# Patient Record
Sex: Male | Born: 1950 | Race: White | Hispanic: No | Marital: Married | Smoking: Current every day smoker
Health system: Southern US, Community
[De-identification: ages and names within clinical notes are randomized; demographics above are authoritative.]

## PROBLEM LIST (undated history)

## (undated) DIAGNOSIS — F329 Major depressive disorder, single episode, unspecified: Secondary | ICD-10-CM

## (undated) DIAGNOSIS — S8290XB Unspecified fracture of unspecified lower leg, initial encounter for open fracture type I or II: Secondary | ICD-10-CM

## (undated) DIAGNOSIS — J45909 Unspecified asthma, uncomplicated: Secondary | ICD-10-CM

## (undated) DIAGNOSIS — H6060 Unspecified chronic otitis externa, unspecified ear: Secondary | ICD-10-CM

## (undated) DIAGNOSIS — M5030 Other cervical disc degeneration, unspecified cervical region: Secondary | ICD-10-CM

## (undated) DIAGNOSIS — S82891A Other fracture of right lower leg, initial encounter for closed fracture: Secondary | ICD-10-CM

## (undated) DIAGNOSIS — F32A Depression, unspecified: Secondary | ICD-10-CM

## (undated) DIAGNOSIS — G4700 Insomnia, unspecified: Secondary | ICD-10-CM

## (undated) DIAGNOSIS — K449 Diaphragmatic hernia without obstruction or gangrene: Secondary | ICD-10-CM

## (undated) DIAGNOSIS — E7800 Pure hypercholesterolemia, unspecified: Secondary | ICD-10-CM

## (undated) DIAGNOSIS — G510 Bell's palsy: Secondary | ICD-10-CM

## (undated) DIAGNOSIS — F419 Anxiety disorder, unspecified: Secondary | ICD-10-CM

## (undated) DIAGNOSIS — S5290XB Unspecified fracture of unspecified forearm, initial encounter for open fracture type I or II: Secondary | ICD-10-CM

## (undated) DIAGNOSIS — I10 Essential (primary) hypertension: Secondary | ICD-10-CM

## (undated) DIAGNOSIS — I82409 Acute embolism and thrombosis of unspecified deep veins of unspecified lower extremity: Secondary | ICD-10-CM

## (undated) HISTORY — DX: Major depressive disorder, single episode, unspecified: F32.9

## (undated) HISTORY — PX: BACK SURGERY: SHX140

## (undated) HISTORY — DX: Unspecified fracture of unspecified lower leg, initial encounter for open fracture type I or II: S82.90XB

## (undated) HISTORY — DX: Unspecified fracture of unspecified forearm, initial encounter for open fracture type I or II: S52.90XB

## (undated) HISTORY — DX: Insomnia, unspecified: G47.00

## (undated) HISTORY — DX: Other cervical disc degeneration, unspecified cervical region: M50.30

## (undated) HISTORY — DX: Diaphragmatic hernia without obstruction or gangrene: K44.9

## (undated) HISTORY — DX: Unspecified chronic otitis externa, unspecified ear: H60.60

## (undated) HISTORY — DX: Other fracture of right lower leg, initial encounter for closed fracture: S82.891A

## (undated) HISTORY — PX: APPENDECTOMY: SHX54

## (undated) HISTORY — DX: Anxiety disorder, unspecified: F41.9

## (undated) HISTORY — DX: Depression, unspecified: F32.A

---

## 2002-07-27 ENCOUNTER — Encounter: Admission: RE | Admit: 2002-07-27 | Discharge: 2002-10-25 | Payer: Self-pay

## 2002-10-11 ENCOUNTER — Encounter: Payer: Self-pay | Admitting: Specialist

## 2002-10-16 ENCOUNTER — Encounter: Payer: Self-pay | Admitting: Specialist

## 2002-10-16 ENCOUNTER — Ambulatory Visit (HOSPITAL_COMMUNITY): Admission: RE | Admit: 2002-10-16 | Discharge: 2002-10-17 | Payer: Self-pay | Admitting: Specialist

## 2002-11-08 ENCOUNTER — Encounter: Admission: RE | Admit: 2002-11-08 | Discharge: 2003-02-06 | Payer: Self-pay

## 2004-10-04 ENCOUNTER — Emergency Department: Payer: Self-pay | Admitting: Unknown Physician Specialty

## 2007-06-25 DIAGNOSIS — H9319 Tinnitus, unspecified ear: Secondary | ICD-10-CM | POA: Insufficient documentation

## 2007-06-25 DIAGNOSIS — I82409 Acute embolism and thrombosis of unspecified deep veins of unspecified lower extremity: Secondary | ICD-10-CM | POA: Insufficient documentation

## 2007-06-25 DIAGNOSIS — M51379 Other intervertebral disc degeneration, lumbosacral region without mention of lumbar back pain or lower extremity pain: Secondary | ICD-10-CM | POA: Insufficient documentation

## 2007-06-25 DIAGNOSIS — G510 Bell's palsy: Secondary | ICD-10-CM | POA: Insufficient documentation

## 2007-06-25 DIAGNOSIS — E669 Obesity, unspecified: Secondary | ICD-10-CM | POA: Insufficient documentation

## 2007-06-25 DIAGNOSIS — I10 Essential (primary) hypertension: Secondary | ICD-10-CM | POA: Insufficient documentation

## 2007-06-25 DIAGNOSIS — M5030 Other cervical disc degeneration, unspecified cervical region: Secondary | ICD-10-CM | POA: Insufficient documentation

## 2007-06-25 DIAGNOSIS — H6690 Otitis media, unspecified, unspecified ear: Secondary | ICD-10-CM | POA: Insufficient documentation

## 2007-06-25 DIAGNOSIS — M5137 Other intervertebral disc degeneration, lumbosacral region: Secondary | ICD-10-CM | POA: Insufficient documentation

## 2007-06-25 DIAGNOSIS — H9190 Unspecified hearing loss, unspecified ear: Secondary | ICD-10-CM | POA: Insufficient documentation

## 2010-03-03 ENCOUNTER — Emergency Department: Payer: Self-pay | Admitting: Unknown Physician Specialty

## 2011-12-06 ENCOUNTER — Emergency Department: Payer: Self-pay | Admitting: Emergency Medicine

## 2011-12-06 LAB — CBC
HCT: 46.4 % (ref 40.0–52.0)
HGB: 15.9 g/dL (ref 13.0–18.0)
MCH: 32.8 pg (ref 26.0–34.0)
WBC: 14.2 10*3/uL — ABNORMAL HIGH (ref 3.8–10.6)

## 2011-12-07 ENCOUNTER — Emergency Department: Payer: Self-pay | Admitting: Unknown Physician Specialty

## 2011-12-07 LAB — COMPREHENSIVE METABOLIC PANEL
Albumin: 3.6 g/dL (ref 3.4–5.0)
Alkaline Phosphatase: 49 U/L — ABNORMAL LOW (ref 50–136)
Anion Gap: 11 (ref 7–16)
BUN: 14 mg/dL (ref 7–18)
Bilirubin,Total: 0.4 mg/dL (ref 0.2–1.0)
Chloride: 102 mmol/L (ref 98–107)
EGFR (African American): >60
EGFR (Non-African Amer.): >60
Glucose: 134 mg/dL — ABNORMAL HIGH (ref 65–99)
Osmolality: 280 (ref 275–301)
Potassium: 3.8 mmol/L (ref 3.5–5.1)
SGOT(AST): 29 U/L (ref 15–37)
SGPT (ALT): 31 U/L
Total Protein: 7.6 g/dL (ref 6.4–8.2)

## 2011-12-07 LAB — TROPONIN I: Troponin-I: <0.02 ng/mL

## 2013-01-25 ENCOUNTER — Emergency Department: Payer: Self-pay | Admitting: Internal Medicine

## 2014-09-14 ENCOUNTER — Other Ambulatory Visit: Payer: Self-pay | Admitting: Family Medicine

## 2014-09-14 DIAGNOSIS — M549 Dorsalgia, unspecified: Principal | ICD-10-CM

## 2014-09-14 DIAGNOSIS — G8929 Other chronic pain: Secondary | ICD-10-CM

## 2014-09-17 ENCOUNTER — Other Ambulatory Visit: Payer: Self-pay | Admitting: Family Medicine

## 2014-09-27 ENCOUNTER — Other Ambulatory Visit: Payer: Self-pay | Admitting: Family Medicine

## 2014-09-27 DIAGNOSIS — Z77018 Contact with and (suspected) exposure to other hazardous metals: Secondary | ICD-10-CM

## 2014-09-27 DIAGNOSIS — M549 Dorsalgia, unspecified: Principal | ICD-10-CM

## 2014-09-27 DIAGNOSIS — G8929 Other chronic pain: Secondary | ICD-10-CM

## 2014-09-27 DIAGNOSIS — M542 Cervicalgia: Secondary | ICD-10-CM

## 2014-10-02 ENCOUNTER — Other Ambulatory Visit: Payer: Self-pay | Admitting: Family Medicine

## 2014-10-15 ENCOUNTER — Ambulatory Visit
Admission: RE | Admit: 2014-10-15 | Discharge: 2014-10-15 | Disposition: A | Discharge status: Home / Self Care | Payer: PRIVATE HEALTH INSURANCE | Source: Ambulatory Visit | Attending: Family Medicine | Admitting: Family Medicine

## 2014-10-15 DIAGNOSIS — M549 Dorsalgia, unspecified: Principal | ICD-10-CM

## 2014-10-15 DIAGNOSIS — G8929 Other chronic pain: Secondary | ICD-10-CM

## 2014-10-15 DIAGNOSIS — Z77018 Contact with and (suspected) exposure to other hazardous metals: Secondary | ICD-10-CM

## 2014-10-15 DIAGNOSIS — M542 Cervicalgia: Secondary | ICD-10-CM

## 2015-01-24 DIAGNOSIS — J449 Chronic obstructive pulmonary disease, unspecified: Secondary | ICD-10-CM | POA: Insufficient documentation

## 2015-05-27 ENCOUNTER — Encounter: Payer: Self-pay | Admitting: Emergency Medicine

## 2015-05-27 ENCOUNTER — Emergency Department
Admission: EM | Admit: 2015-05-27 | Discharge: 2015-05-27 | Disposition: A | Discharge status: Home / Self Care | Payer: Medicare Other | Attending: Emergency Medicine | Admitting: Emergency Medicine

## 2015-05-27 DIAGNOSIS — K029 Dental caries, unspecified: Secondary | ICD-10-CM | POA: Diagnosis not present

## 2015-05-27 DIAGNOSIS — K088 Other specified disorders of teeth and supporting structures: Secondary | ICD-10-CM | POA: Diagnosis present

## 2015-05-27 DIAGNOSIS — Z720 Tobacco use: Secondary | ICD-10-CM | POA: Diagnosis not present

## 2015-05-27 DIAGNOSIS — K047 Periapical abscess without sinus: Secondary | ICD-10-CM | POA: Insufficient documentation

## 2015-05-27 MED ORDER — LIDOCAINE HCL (PF) 1 % IJ SOLN
2.0000 mL | Freq: Once | INTRAMUSCULAR | Status: DC
  Filled 2015-05-27: qty 5

## 2015-05-27 MED ORDER — AMOXICILLIN-POT CLAVULANATE 200-28.5 MG PO CHEW: 1.0000 | CHEWABLE_TABLET | Freq: Two times a day (BID) | ORAL | Status: AC

## 2015-05-27 MED ORDER — TRAMADOL HCL 50 MG PO TABS: 50.0000 mg | ORAL_TABLET | Freq: Four times a day (QID) | ORAL | Status: AC | PRN

## 2015-05-27 MED ORDER — BUPIVACAINE HCL (PF) 0.5 % IJ SOLN
2.0000 mL | Freq: Once | INTRAMUSCULAR | Status: DC
Start: 2015-05-27 — End: 2015-05-27

## 2015-05-27 MED ORDER — LIDOCAINE-EPINEPHRINE 2 %-1:100000 IJ SOLN
INTRAMUSCULAR | Status: AC
  Filled 2015-05-27: qty 1.7

## 2015-05-27 NOTE — ED Notes (Signed)
Reports painful tooth.  No resp distress

## 2015-05-27 NOTE — Discharge Instructions (Signed)

## 2015-05-27 NOTE — ED Provider Notes (Signed)
[facility] Emergency Department Provider Note  ____________________________________________  Time seen: Approximately 10:50 AM  I have reviewed the triage vital signs and the nursing notes.   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  HPI Donald Hester is a 64 y.o. male who presents to the emergency room for a abscessed tooth. He states that he has poor dentition as a whole but does not have finances for [employer]. He states that he is just set up to go to [facility] dental care. He is complaining of an abscessed right canine tooth. Pain, swelling present. No distended complaints of fever, difficulty breathing, difficulty swallowing. Pain is moderate to severe.   History reviewed. No pertinent past medical history.  There are no active problems to display for this patient.   History reviewed. No pertinent past surgical history.  Current Outpatient Rx  Name  Route  Sig  Dispense  Refill  . amoxicillin-clavulanate (AUGMENTIN) 200-28.5 MG per chewable tablet   Oral   Chew 1 tablet by mouth 2 (two) times daily.   14 tablet   0   . traMADol (ULTRAM) 50 MG tablet   Oral   Take 1 tablet (50 mg total) by mouth every 6 (six) hours as needed.   20 tablet   0     Allergies Review of patient's allergies indicates no known allergies.  History reviewed. No pertinent family history.  Social History Social History  Substance Use Topics  . Smoking status: Current Every Day Smoker  . Smokeless tobacco: None  . Alcohol Use: No    Review of Systems Constitutional: No fever/chills Eyes: No visual changes. ENT: No sore throat. Positive right lower canine tooth pain Cardiovascular: Denies chest pain. Respiratory: Denies shortness of breath. Gastrointestinal: No abdominal pain.  No nausea, no vomiting.  No diarrhea.  No constipation. Genitourinary: Negative for dysuria. Musculoskeletal: Negative for back pain. Skin: Negative  for rash. Neurological: Negative for headaches, focal weakness or numbness.  10-point ROS otherwise negative.  ____________________________________________   PHYSICAL EXAM:  VITAL SIGNS: ED Triage Vitals  Enc Vitals Group     BP 05/27/15 1023 128/81 mmHg     Pulse Rate 05/27/15 1023 97     Resp 05/27/15 1023 20     Temp 05/27/15 1023 97.9 F (36.6 C)     Temp Source 05/27/15 1023 Oral     SpO2 05/27/15 1023 97 %     Weight 05/27/15 1023 180 lb (81.647 kg)     Height 05/27/15 1023 5\' 7"  (1.702 m)     Head Cir --      Peak Flow --      Pain Score 05/27/15 1016 8     Pain Loc --      Pain Edu? --      Excl. in [place]? --     Constitutional: Alert and oriented. Well appearing and in no acute distress. Eyes: Conjunctivae are normal. PERRL. EOMI. Head: Atraumatic. Nose: No congestion/rhinnorhea. Mouth/Throat: Mucous membranes are moist.  Oropharynx non-erythematous. Multiple dental caries throughout mouth. No dentition upper. Erythema and edema surrounding right lower canine. Neck: No stridor.   Hematological/Lymphatic/Immunilogical: No cervical lymphadenopathy. Cardiovascular: Normal rate, regular rhythm. Grossly normal heart sounds.  Good peripheral circulation. Respiratory: Normal respiratory effort.  No retractions. Lungs CTAB. Gastrointestinal: Soft and nontender. No distention. No abdominal bruits. No CVA tenderness. Musculoskeletal: No lower extremity tenderness nor edema.  No joint effusions. Neurologic:  Normal speech and language. No gross  focal neurologic deficits are appreciated. No gait instability. Skin:  Skin is warm, dry and intact. No rash noted. Psychiatric: Mood and affect are normal. Speech and behavior are normal.  ____________________________________________   LABS (all labs ordered are listed, but only abnormal results are displayed)  Labs Reviewed - No data to  display ____________________________________________  EKG   ____________________________________________  RADIOLOGY   ____________________________________________   PROCEDURES  Procedure(s) performed: yes, dental block, see procedure note(s).    Critical Care performed: No  ____________________________________________   INITIAL IMPRESSION / ASSESSMENT AND PLAN / ED COURSE  Pertinent labs & imaging results that were available during my care of the patient were reviewed by me and considered in my medical decision making (see chart for details).  Patient presents with a obvious history of poor dental hygiene, caries, abscesses. Exam and symptoms consistent with periodontal abscess. Will prescribe patient oral antibiotics, pain medication, and dental block here in the ER. Patient aware of findings and treatment plan. Patient agreeable with same. States that he will follow-up with [facility] dental care. ____________________________________________   FINAL CLINICAL IMPRESSION(S) / ED DIAGNOSES  Final diagnoses:  Tooth abscess      Darletta Moll, PA-C 05/27/15 [place], MD 05/27/15 [phone]

## 2015-07-22 DIAGNOSIS — M48061 Spinal stenosis, lumbar region without neurogenic claudication: Secondary | ICD-10-CM | POA: Insufficient documentation

## 2016-04-23 ENCOUNTER — Emergency Department: Payer: Medicare Other

## 2016-04-23 ENCOUNTER — Emergency Department
Admission: EM | Admit: 2016-04-23 | Discharge: 2016-04-23 | Disposition: A | Discharge status: Home / Self Care | Payer: Medicare Other | Attending: Emergency Medicine | Admitting: Emergency Medicine

## 2016-04-23 ENCOUNTER — Encounter: Payer: Self-pay | Admitting: Emergency Medicine

## 2016-04-23 DIAGNOSIS — R0602 Shortness of breath: Secondary | ICD-10-CM | POA: Diagnosis present

## 2016-04-23 DIAGNOSIS — R0600 Dyspnea, unspecified: Secondary | ICD-10-CM

## 2016-04-23 DIAGNOSIS — R1084 Generalized abdominal pain: Secondary | ICD-10-CM | POA: Diagnosis not present

## 2016-04-23 DIAGNOSIS — I82412 Acute embolism and thrombosis of left femoral vein: Secondary | ICD-10-CM | POA: Insufficient documentation

## 2016-04-23 DIAGNOSIS — F17200 Nicotine dependence, unspecified, uncomplicated: Secondary | ICD-10-CM | POA: Insufficient documentation

## 2016-04-23 DIAGNOSIS — R079 Chest pain, unspecified: Secondary | ICD-10-CM

## 2016-04-23 DIAGNOSIS — M7989 Other specified soft tissue disorders: Secondary | ICD-10-CM

## 2016-04-23 DIAGNOSIS — M79662 Pain in left lower leg: Secondary | ICD-10-CM

## 2016-04-23 DIAGNOSIS — J45909 Unspecified asthma, uncomplicated: Secondary | ICD-10-CM | POA: Insufficient documentation

## 2016-04-23 DIAGNOSIS — I10 Essential (primary) hypertension: Secondary | ICD-10-CM | POA: Insufficient documentation

## 2016-04-23 DIAGNOSIS — Z86718 Personal history of other venous thrombosis and embolism: Secondary | ICD-10-CM

## 2016-04-23 DIAGNOSIS — R0789 Other chest pain: Secondary | ICD-10-CM

## 2016-04-23 HISTORY — DX: Pure hypercholesterolemia, unspecified: E78.00

## 2016-04-23 HISTORY — DX: Unspecified asthma, uncomplicated: J45.909

## 2016-04-23 HISTORY — DX: Bell's palsy: G51.0

## 2016-04-23 HISTORY — DX: Essential (primary) hypertension: I10

## 2016-04-23 HISTORY — DX: Acute embolism and thrombosis of unspecified deep veins of unspecified lower extremity: I82.409

## 2016-04-23 LAB — HEPATIC FUNCTION PANEL
ALT: 13 U/L — ABNORMAL LOW (ref 17–63)
AST: 19 U/L (ref 15–41)
Albumin: 3.5 g/dL (ref 3.5–5.0)
Alkaline Phosphatase: 38 U/L (ref 38–126)
BILIRUBIN TOTAL: 0.4 mg/dL (ref 0.3–1.2)
Bilirubin, Direct: 0.1 mg/dL (ref 0.1–0.5)
Indirect Bilirubin: 0.3 mg/dL (ref 0.3–0.9)
TOTAL PROTEIN: 6.6 g/dL (ref 6.5–8.1)

## 2016-04-23 LAB — BASIC METABOLIC PANEL
Anion gap: 3 — ABNORMAL LOW (ref 5–15)
BUN: 14 mg/dL (ref 6–20)
CHLORIDE: 103 mmol/L (ref 101–111)
CO2: 31 mmol/L (ref 22–32)
CREATININE: 1.04 mg/dL (ref 0.61–1.24)
Calcium: 9.6 mg/dL (ref 8.9–10.3)
Glucose, Bld: 134 mg/dL — ABNORMAL HIGH (ref 65–99)
POTASSIUM: 3.9 mmol/L (ref 3.5–5.1)
Sodium: 137 mmol/L (ref 135–145)

## 2016-04-23 LAB — CBC
HEMATOCRIT: 41 % (ref 40.0–52.0)
Hemoglobin: 14 g/dL (ref 13.0–18.0)
MCH: 31.8 pg (ref 26.0–34.0)
MCHC: 34 g/dL (ref 32.0–36.0)
MCV: 93.3 fL (ref 80.0–100.0)
PLATELETS: 161 10*3/uL (ref 150–440)
RBC: 4.4 MIL/uL (ref 4.40–5.90)
RDW: 14.7 % — AB (ref 11.5–14.5)
WBC: 12.9 10*3/uL — ABNORMAL HIGH (ref 3.8–10.6)

## 2016-04-23 LAB — LIPASE, BLOOD: LIPASE: 15 U/L (ref 11–51)

## 2016-04-23 LAB — TROPONIN I: Troponin I: <0.03 ng/mL (ref <0.03)

## 2016-04-23 MED ORDER — DIPHENHYDRAMINE HCL 50 MG/ML IJ SOLN
50.0000 mg | Freq: Once | INTRAMUSCULAR | Status: AC
  Administered 2016-04-23: 50 mg via INTRAVENOUS
  Filled 2016-04-23: qty 1

## 2016-04-23 MED ORDER — HYDROCORTISONE NA SUCCINATE PF 100 MG IJ SOLR
200.0000 mg | Freq: Once | INTRAMUSCULAR | Status: AC
  Administered 2016-04-23: 200 mg via INTRAVENOUS
  Filled 2016-04-23: qty 4

## 2016-04-23 MED ORDER — APIXABAN 5 MG PO TABS: ORAL_TABLET | ORAL | 0 refills | Status: DC

## 2016-04-23 MED ORDER — DIPHENHYDRAMINE HCL 50 MG/ML IJ SOLN: 50.0000 mg | Freq: Once | INTRAMUSCULAR | Status: DC

## 2016-04-23 MED ORDER — TECHNETIUM TC 99M DIETHYLENETRIAME-PENTAACETIC ACID
32.4840 | Freq: Once | INTRAVENOUS | Status: AC | PRN
  Administered 2016-04-23: 32.484 via INTRAVENOUS

## 2016-04-23 MED ORDER — DIATRIZOATE MEGLUMINE & SODIUM 66-10 % PO SOLN: 15.0000 mL | Freq: Once | ORAL | Status: DC

## 2016-04-23 MED ORDER — APIXABAN 5 MG PO TABS
10.0000 mg | ORAL_TABLET | ORAL | Status: AC
  Administered 2016-04-23: 10 mg via ORAL
  Filled 2016-04-23: qty 2

## 2016-04-23 MED ORDER — TECHNETIUM TO 99M ALBUMIN AGGREGATED
4.0000 | Freq: Once | INTRAVENOUS | Status: AC | PRN
  Administered 2016-04-23: 3.658 via INTRAVENOUS

## 2016-04-23 NOTE — ED Triage Notes (Addendum)
C/O left lower leg swelling x 5 days.  Pain radiating from leg to groin and abdomen to back.  Patient has history of DVT, last in 1993.  Patient is not on anticoagulant therapy.  Also c/o chest pain and sob.

## 2016-04-23 NOTE — ED Notes (Signed)
EDP at bedside  

## 2016-04-23 NOTE — ED Provider Notes (Addendum)
[facility] Emergency Department Provider Note  ____________________________________________  Time seen: Approximately 5:44 PM  I have reviewed the triage vital signs and the nursing notes.   HISTORY  Chief Complaint Leg Swelling    HPI Donald Hester is a 65 y.o. male who complains of left leg swelling and pain for the past 5 days, associated with constant chest pain described as heaviness centrally for the past 2 days. Chest pain is nonradiating, not exertional but he does feel short of breath and has decreased exercise tolerance over the past few days to just about 50 yards. This did not used to be an issue for him. No aggravating or alleviating factors for the chest pain. No diaphoresis or vomiting. No history of heart attacks.  He has a history of DVT in the left lower extremity from about 20 or 25 years ago. He is not currently on any anticoagulants. No new trauma hospitalizations or surgeries. No long travel. He has continued to smoke a pack a day.     Past Medical History:  Diagnosis Date  . Asthma   . Bell's palsy   . DVT (deep venous thrombosis) ([place])   . High cholesterol   . Hypertension      There are no active problems to display for this patient.    Past Surgical History:  Procedure Laterality Date  . APPENDECTOMY    . BACK SURGERY       Prior to Admission medications   Medication Sig Start Date End Date Taking? Authorizing Provider  apixaban (ELIQUIS) 5 MG TABS tablet Take 10mg  twice daily by mouth for 7 days.  On day 8, decrease dose to 5mg  twice daily by mouth. 04/23/16   Carrie Mew, MD  traMADol (ULTRAM) 50 MG tablet Take 1 tablet (50 mg total) by mouth every 6 (six) hours as needed. 05/27/15 05/26/16  Roderic Palau D Cuthriell, PA-C     Allergies Dye fdc red [red dye] and Iodinated diagnostic agents   No family history on file.  Social History Social History  Substance Use Topics  . Smoking status: Current Every Day  Smoker  . Smokeless tobacco: Never Used  . Alcohol use No    Review of Systems  Constitutional:   No fever or chills.  ENT:   No sore throat. No rhinorrhea. Cardiovascular:   Positive as above chest pain. Respiratory:   Positive shortness of breath without cough. Gastrointestinal:   Positive generalized abdominal pain, no vomiting or diarrhea.  Genitourinary:   Negative for dysuria or difficulty urinating. Musculoskeletal: Left leg pain and swelling Neurological:   Negative for headaches 10-point ROS otherwise negative.  ____________________________________________   PHYSICAL EXAM:  VITAL SIGNS: ED Triage Vitals  Enc Vitals Group     BP 04/23/16 1456 114/61     Pulse Rate 04/23/16 1456 96     Resp 04/23/16 1456 18     Temp 04/23/16 1456 99 F (37.2 C)     Temp Source 04/23/16 1456 Oral     SpO2 04/23/16 1456 95 %     Weight 04/23/16 1455 262 lb (118.8 kg)     Height 04/23/16 1455 5\' 11"  (1.803 m)     Head Circumference --      Peak Flow --      Pain Score 04/23/16 1507 8     Pain Loc --      Pain Edu? --      Excl. in [place]? --     Vital signs  reviewed, nursing assessments reviewed.   Constitutional:   Alert and oriented. Well appearing and in no distress. Eyes:   No scleral icterus. No conjunctival pallor. PERRL. EOMI.  No nystagmus. ENT   Head:   Normocephalic and atraumatic.   Nose:   No congestion/rhinnorhea. No septal hematoma   Mouth/Throat:   MMM, no pharyngeal erythema. No peritonsillar mass.    Neck:   No stridor. No SubQ emphysema. No meningismus. Hematological/Lymphatic/Immunilogical:   No cervical lymphadenopathy. Cardiovascular:   RRR. Symmetric bilateral radial and DP pulses.  No murmurs.  Respiratory:   Normal respiratory effort without tachypnea nor retractions. Breath sounds are clear and equal bilaterally. No wheezes/rales/rhonchi.No noticeable wheezing with forceful expiration Gastrointestinal:   Soft with generalized tenderness. Mild  distention.. There is no CVA tenderness.  No rebound, rigidity, or guarding. Genitourinary:   deferred Musculoskeletal:   1+ pitting edema of the left lower extremity. The calf circumference on the left is much greater than the right. There is trace edema on the right. No erythema. No Homans sign palpable cords or calf tenderness. Neurologic:   Normal speech and language.  CN 2-10 normal. Motor grossly intact. No gross focal neurologic deficits are appreciated.  Skin:    Skin is warm, dry and intact. No rash noted.  No petechiae, purpura, or bullae.  ____________________________________________    LABS (pertinent positives/negatives) (all labs ordered are listed, but only abnormal results are displayed) Labs Reviewed  BASIC METABOLIC PANEL - Abnormal; Notable for the following:       Result Value   Glucose, Bld 134 (*)    Anion gap 3 (*)    All other components within normal limits  CBC - Abnormal; Notable for the following:    WBC 12.9 (*)    RDW 14.7 (*)    All other components within normal limits  HEPATIC FUNCTION PANEL - Abnormal; Notable for the following:    ALT 13 (*)    All other components within normal limits  TROPONIN I  LIPASE, BLOOD   ____________________________________________   EKG  Interpreted by me Normal sinus rhythm rate of 96, normal axis intervals ST segments and T waves. There is poor progression in anterior precordial lead.  ____________________________________________    RADIOLOGY Ultrasound left lower extremity Positive for acute occlusive DVT involving the left common femoral vein, femoral vein, saphenofemoral junction, and profunda femoral vein Nuclear medicine VQ scan very low probability for PE.  ____________________________________________   PROCEDURES Procedures  ____________________________________________   INITIAL IMPRESSION / ASSESSMENT AND PLAN / ED COURSE  Pertinent labs & imaging results that were available during my  care of the patient were reviewed by me and considered in my medical decision making (see chart for details).  Patient presents with chest pain shortness breath and left leg swelling. Ultrasound reveals an acute occlusive DVT of the left common femoral vein. It appears extensive on ultrasound. He is also diffusely tender in the abdomen. We'll get CT of the chest abdomen pelvis for further evaluation, and thyroid for PE. Add on LFTs and lipase to his chemistry.     Clinical Course  Comment By Time  Patient reports severe reaction to IV imaging contrast in the past. He is unable to state whether was to CT or MRI. Not able to find any contrast enhanced radiology studies in the past in the medical record. Due to uncertainty but the risk of a severe reaction, will avoid IV contrast enhanced CT. Noncontrast CT abdomen. VQ scan for the lungs.  Carrie Mew, MD 08/17 1800    ----------------------------------------- 11:24 PM on 04/23/2016 -----------------------------------------  VQ scan performed. Results not available in electronic medical record on my view, but I contacted the radiology department who reports that it has been red. Received verbal report over the phone that it is read as very low probability for PE by Dr. Lucienne Capers radiologist. Vital signs unremarkable. Patient ambulatory around the emergency Department with only minimal symptoms. We'll give oral Eliquis in the ED with prescription. Follow up with primary care and hematology. ____________________________________________   FINAL CLINICAL IMPRESSION(S) / ED DIAGNOSES  Final diagnoses:  Dyspnea  Chest pain  Leg swelling  Acute deep vein thrombosis (DVT) of femoral vein of left lower extremity ([place])       Portions of this note were generated with dragon dictation software. Dictation errors may occur despite best attempts at proofreading.    Carrie Mew, MD 04/23/16 2325   Patient sitting upright,  tolerating oral intake, very calm and comfortable. All questions answered. Patient is eager to go home and will follow up with primary care.   Carrie Mew, MD 04/23/16 [id number]

## 2016-04-23 NOTE — ED Notes (Signed)
Pt wanted to go see wife in room 30.  Nurse Odis Hollingshead, RN for pt's wife notified.  EDP okay'd pt to walk over to see his wife and to have a small cup of water if pt wanted.  Pt educated by EDP on reasons for staying.  Pt agreeable at this time.

## 2016-04-23 NOTE — ED Notes (Signed)
Pt discharged to home.  Family member driving.  Discharge instructions reviewed.  Verbalized understanding.  No questions or concerns at this time.  Teach back verified.  Pt in NAD.  No items left in ED.   

## 2016-04-23 NOTE — ED Notes (Signed)
Pt returned from CT, resting in bed 

## 2016-04-23 NOTE — ED Notes (Signed)
Pt resting in bed, family at bedside.  Family gave number due to having another family member in ER at this time and going back and forth between rooms.  Pt asking when he will get to go home.  Educated patient about waiting for scan to be performed and then once resulted the EDP would be able to discuss plan of care further with him.

## 2016-04-23 NOTE — ED Notes (Signed)
Patient transported to CT 

## 2016-04-28 ENCOUNTER — Telehealth: Payer: Self-pay | Admitting: Emergency Medicine

## 2016-04-28 NOTE — Telephone Encounter (Signed)
Called to discuss follow up plans.  Left message asking him to call me.

## 2016-04-28 NOTE — Telephone Encounter (Addendum)
04/28/2016 Called patient again and no answer.  The patient has an appointment Sept 7 at [facility].  I spoke with Dr. Sherian Rein at [facility] [phone]) , and she will be following patient and discussing [facility].  He has had pulmonary nodules they have been following there.  Spoke with Dr. Felicita Gage at [facility].  She agrees to follow up on aortic abnormalities.

## 2016-05-14 ENCOUNTER — Inpatient Hospital Stay: Payer: Medicare Other | Admitting: Hematology and Oncology

## 2016-09-15 DIAGNOSIS — I82409 Acute embolism and thrombosis of unspecified deep veins of unspecified lower extremity: Secondary | ICD-10-CM | POA: Insufficient documentation

## 2016-09-15 DIAGNOSIS — I119 Hypertensive heart disease without heart failure: Secondary | ICD-10-CM | POA: Insufficient documentation

## 2017-11-22 ENCOUNTER — Encounter: Payer: Self-pay | Admitting: Gastroenterology

## 2017-11-23 ENCOUNTER — Encounter: Payer: Self-pay | Admitting: Gastroenterology

## 2017-11-23 ENCOUNTER — Encounter (INDEPENDENT_AMBULATORY_CARE_PROVIDER_SITE_OTHER): Payer: Self-pay

## 2017-11-23 ENCOUNTER — Ambulatory Visit (INDEPENDENT_AMBULATORY_CARE_PROVIDER_SITE_OTHER): Payer: Medicare Other | Admitting: Gastroenterology

## 2017-11-23 VITALS — BP 146/93 | HR 64 | Ht 70.0 in | Wt 286.2 lb

## 2017-11-23 DIAGNOSIS — R195 Other fecal abnormalities: Secondary | ICD-10-CM

## 2017-11-23 MED ORDER — PEG 3350-KCL-NA BICARB-NACL 420 G PO SOLR: 4000.0000 mL | Freq: Once | ORAL | 0 refills | Status: AC

## 2017-11-23 NOTE — Progress Notes (Signed)
Jonathon Bellows MD, MRCP(U.K) [address]  [place]  [place], [place] [address]  [place]: [phone]  Fax: [phone]   Gastroenterology Consultation  Referring Provider:     [facility], [place] Physician:  [place] Primary Gastroenterologist:  Dr. Jonathon Bellows  Reason for Consultation:     Positive FIT test         HPI:   Donald Hester is a 67 y.o. y/o male referred for consultation & management  by Dr. Domingo Madeira, [facility].  \\\  He has been referred for a colonoscopy for a positive FIT test .   He says that for the past 20-30 years has seen blood on the tissue when he wipes, He says he has had moles over his anus for many years. Never had a colonoscopy. Says he was supposed to have one last year. He is on Eloquis for blood clots. He is a smoker for his last 9 years. Denies any weight loss. No change in the shape of his stool. No colon cancer in the family. No polyps in the family either. Mother had lung cancer.     Past Medical History:  Diagnosis Date  . Asthma   . Bell's palsy   . DVT (deep venous thrombosis) ([place])   . High cholesterol   . Hypertension     Past Surgical History:  Procedure Laterality Date  . APPENDECTOMY    . BACK SURGERY      Prior to Admission medications   Medication Sig Start Date End Date Taking? Authorizing Provider  albuterol (PROVENTIL HFA;VENTOLIN HFA) 108 (90 Base) MCG/ACT inhaler Inhale 1 puff into the lungs every 6 (six) hours as needed for wheezing or shortness of breath.   Yes [provider]  apixaban (ELIQUIS) 5 MG TABS tablet Take 10mg  twice daily by mouth for 7 days.  On day 8, decrease dose to 5mg  twice daily by mouth. 04/23/16  Yes Carrie Mew, MD  budesonide-formoterol [facility]) 160-4.5 MCG/ACT inhaler Inhale 2 puffs into the lungs 2 (two) times daily.   Yes [provider]  Calcium Carb-Cholecalciferol (CALCIUM 600-D PO) Take 600 mg by mouth daily.   Yes  [provider]  cetirizine (ZYRTEC) 10 MG tablet Take 5 mg by mouth 2 (two) times daily.   Yes [provider]  docusate sodium (COLACE) 100 MG capsule Take 100 mg by mouth 2 (two) times daily.   Yes [provider]  etodolac (LODINE) 400 MG tablet Take 400 mg by mouth 2 (two) times daily.   Yes [provider]  fenofibrate (TRICOR) 145 MG tablet Take 145 mg by mouth daily.   Yes [provider]  lisinopril (PRINIVIL,ZESTRIL) 10 MG tablet Take 5 mg by mouth daily.   Yes [provider]  methocarbamol (ROBAXIN) 500 MG tablet Take 500 mg by mouth 4 (four) times daily.   Yes [provider]  Multiple Vitamin (MULTIVITAMIN) capsule Take 1 capsule by mouth daily.   Yes [provider]  Omega-3 Fatty Acids (FISH OIL) 1000 MG CAPS Take 1,000 mg by mouth daily.   Yes [provider]  tiotropium (SPIRIVA) 18 MCG inhalation capsule Place 18 mcg into inhaler and inhale daily.   Yes [provider]    No family history on file.   Social History   Tobacco Use  . Smoking status: Current Every Day Smoker    Types: Cigarettes  . Smokeless tobacco: Never Used  Substance Use  Topics  . Alcohol use: No  . Drug use: No    Allergies as of 11/23/2017 - Review Complete 11/23/2017  Allergen Reaction Noted  . Dye fdc red [red dye] Anaphylaxis 04/23/2016  . Iodinated diagnostic agents Swelling 04/23/2016    Review of Systems:    All systems reviewed and negative except where noted in HPI.   Physical Exam:  BP (!) 146/93 (BP Location: Left Arm, Patient Position: Sitting, Cuff Size: Large)   Pulse 64   Ht 5\' 10"  (1.778 m)   Wt 286 lb 3.2 oz (129.8 kg)   BMI 41.07 kg/m  No LMP for male patient. Psych:  Alert and cooperative. Normal mood and affect. General:   Alert,  Well-developed, well-nourished, pleasant and cooperative in NAD Head:  Normocephalic and atraumatic. Eyes:  Sclera clear, no icterus.   Conjunctiva  pink. Ears:  Normal auditory acuity. Nose:  No deformity, discharge, or lesions. Mouth:  No deformity or lesions,oropharynx pink & moist. Neck:  Supple; no masses or thyromegaly. Lungs:  Respirations even and unlabored.  Clear throughout to auscultation.   No wheezes, crackles, or rhonchi. No acute distress. Heart:  Regular rate and rhythm; no murmurs, clicks, rubs, or gallops. Abdomen:  Normal bowel sounds.  No bruits.  Soft, non-tender and non-distended without masses, hepatosplenomegaly or hernias noted.  No guarding or rebound tenderness.    Msk:  Symmetrical without gross deformities. Good, equal movement & strength bilaterally. Pulses:  Normal pulses noted. Extremities:  No clubbing or edema.  No cyanosis. Neurologic:  Alert and oriented x3;  grossly normal neurologically. Skin:  Intact without significant lesions or rashes. No jaundice. Lymph Nodes:  No significant cervical adenopathy. Psych:  Alert and cooperative. Normal mood and affect.  Imaging Studies: No results found.  Assessment and Plan:   Donald Hester is a 67 y.o. y/o male has been referred for a positive FIT test    Plan  1. Diagnostic colonoscopy  2. Eloquis holding instructions    I have discussed alternative options, risks & benefits,  which include, but are not limited to, bleeding, infection, perforation,respiratory complication & drug reaction.  The patient agrees with this plan & written consent will be obtained    Follow up in PR[facility]  Dr Jonathon Bellows MD,MRCP(U.K)

## 2017-12-10 ENCOUNTER — Telehealth: Payer: Self-pay

## 2017-12-10 NOTE — Telephone Encounter (Signed)
LVM advising patient that the ELIQUIS clearance was received per our discussion yesterday.   Hold on 4/9, 4/10, 4/11 Restart based on Dr. Lelon Frohlich but usually 4/12

## 2017-12-13 ENCOUNTER — Telehealth: Payer: Self-pay | Admitting: Gastroenterology

## 2017-12-13 NOTE — Telephone Encounter (Signed)
Pt left vm stating he is very sick with a cold he is schedule for procedure 12/16/17 and needsa to know if he needs to reschedule or not bc if so he needs to start taking his blood thinner again

## 2017-12-20 ENCOUNTER — Telehealth: Payer: Self-pay

## 2017-12-20 NOTE — Telephone Encounter (Signed)
Mr. Donald Hester called to reschedule procedures based from 4/18 to 5/7 due to having a cold.   He states that his [place] approval runs thru August when I expressed concerns about his continued rescheduling.

## 2018-01-10 ENCOUNTER — Encounter: Payer: Self-pay | Admitting: *Deleted

## 2018-01-11 ENCOUNTER — Ambulatory Visit
Admission: RE | Admit: 2018-01-11 | Discharge: 2018-01-11 | Disposition: A | Discharge status: Home / Self Care | Payer: Medicare Other | Source: Ambulatory Visit | Attending: Gastroenterology | Admitting: Gastroenterology

## 2018-01-11 ENCOUNTER — Encounter
Admission: RE | Disposition: A | Discharge status: Home / Self Care | Payer: Self-pay | Source: Ambulatory Visit | Attending: Gastroenterology

## 2018-01-11 ENCOUNTER — Ambulatory Visit: Payer: Medicare Other | Admitting: Certified Registered Nurse Anesthetist

## 2018-01-11 ENCOUNTER — Encounter: Payer: Self-pay | Admitting: *Deleted

## 2018-01-11 DIAGNOSIS — Z833 Family history of diabetes mellitus: Secondary | ICD-10-CM | POA: Insufficient documentation

## 2018-01-11 DIAGNOSIS — R195 Other fecal abnormalities: Secondary | ICD-10-CM | POA: Diagnosis not present

## 2018-01-11 DIAGNOSIS — E7800 Pure hypercholesterolemia, unspecified: Secondary | ICD-10-CM | POA: Insufficient documentation

## 2018-01-11 DIAGNOSIS — Z86718 Personal history of other venous thrombosis and embolism: Secondary | ICD-10-CM | POA: Diagnosis not present

## 2018-01-11 DIAGNOSIS — F329 Major depressive disorder, single episode, unspecified: Secondary | ICD-10-CM | POA: Insufficient documentation

## 2018-01-11 DIAGNOSIS — D375 Neoplasm of uncertain behavior of rectum: Secondary | ICD-10-CM | POA: Diagnosis not present

## 2018-01-11 DIAGNOSIS — Z9102 Food additives allergy status: Secondary | ICD-10-CM | POA: Insufficient documentation

## 2018-01-11 DIAGNOSIS — Z885 Allergy status to narcotic agent status: Secondary | ICD-10-CM | POA: Diagnosis not present

## 2018-01-11 DIAGNOSIS — Z91041 Radiographic dye allergy status: Secondary | ICD-10-CM | POA: Diagnosis not present

## 2018-01-11 DIAGNOSIS — D122 Benign neoplasm of ascending colon: Secondary | ICD-10-CM | POA: Diagnosis not present

## 2018-01-11 DIAGNOSIS — Z820 Family history of epilepsy and other diseases of the nervous system: Secondary | ICD-10-CM | POA: Insufficient documentation

## 2018-01-11 DIAGNOSIS — I10 Essential (primary) hypertension: Secondary | ICD-10-CM | POA: Diagnosis not present

## 2018-01-11 DIAGNOSIS — Z7951 Long term (current) use of inhaled steroids: Secondary | ICD-10-CM | POA: Diagnosis not present

## 2018-01-11 DIAGNOSIS — K219 Gastro-esophageal reflux disease without esophagitis: Secondary | ICD-10-CM | POA: Insufficient documentation

## 2018-01-11 DIAGNOSIS — Z91030 Bee allergy status: Secondary | ICD-10-CM | POA: Diagnosis not present

## 2018-01-11 DIAGNOSIS — D123 Benign neoplasm of transverse colon: Secondary | ICD-10-CM | POA: Diagnosis not present

## 2018-01-11 DIAGNOSIS — H6690 Otitis media, unspecified, unspecified ear: Secondary | ICD-10-CM | POA: Insufficient documentation

## 2018-01-11 DIAGNOSIS — F419 Anxiety disorder, unspecified: Secondary | ICD-10-CM | POA: Insufficient documentation

## 2018-01-11 DIAGNOSIS — Z79899 Other long term (current) drug therapy: Secondary | ICD-10-CM | POA: Insufficient documentation

## 2018-01-11 DIAGNOSIS — K921 Melena: Secondary | ICD-10-CM | POA: Diagnosis present

## 2018-01-11 DIAGNOSIS — F17200 Nicotine dependence, unspecified, uncomplicated: Secondary | ICD-10-CM | POA: Insufficient documentation

## 2018-01-11 DIAGNOSIS — G510 Bell's palsy: Secondary | ICD-10-CM | POA: Diagnosis not present

## 2018-01-11 DIAGNOSIS — K449 Diaphragmatic hernia without obstruction or gangrene: Secondary | ICD-10-CM | POA: Insufficient documentation

## 2018-01-11 DIAGNOSIS — F418 Other specified anxiety disorders: Secondary | ICD-10-CM | POA: Insufficient documentation

## 2018-01-11 DIAGNOSIS — M5030 Other cervical disc degeneration, unspecified cervical region: Secondary | ICD-10-CM | POA: Diagnosis not present

## 2018-01-11 DIAGNOSIS — G4700 Insomnia, unspecified: Secondary | ICD-10-CM | POA: Diagnosis not present

## 2018-01-11 DIAGNOSIS — J45909 Unspecified asthma, uncomplicated: Secondary | ICD-10-CM | POA: Insufficient documentation

## 2018-01-11 DIAGNOSIS — Z8261 Family history of arthritis: Secondary | ICD-10-CM | POA: Insufficient documentation

## 2018-01-11 DIAGNOSIS — Z888 Allergy status to other drugs, medicaments and biological substances status: Secondary | ICD-10-CM | POA: Diagnosis not present

## 2018-01-11 DIAGNOSIS — K621 Rectal polyp: Secondary | ICD-10-CM

## 2018-01-11 DIAGNOSIS — Z825 Family history of asthma and other chronic lower respiratory diseases: Secondary | ICD-10-CM | POA: Insufficient documentation

## 2018-01-11 DIAGNOSIS — Z8249 Family history of ischemic heart disease and other diseases of the circulatory system: Secondary | ICD-10-CM | POA: Insufficient documentation

## 2018-01-11 DIAGNOSIS — Z840 Family history of diseases of the skin and subcutaneous tissue: Secondary | ICD-10-CM | POA: Insufficient documentation

## 2018-01-11 HISTORY — PX: COLONOSCOPY WITH PROPOFOL: SHX5780

## 2018-01-11 SURGERY — COLONOSCOPY WITH PROPOFOL
Anesthesia: General

## 2018-01-11 MED ORDER — PROPOFOL 10 MG/ML IV BOLUS
INTRAVENOUS | Status: DC | PRN
  Administered 2018-01-11: 70 mg via INTRAVENOUS
  Administered 2018-01-11: 30 mg via INTRAVENOUS

## 2018-01-11 MED ORDER — PROPOFOL 500 MG/50ML IV EMUL
INTRAVENOUS | Status: AC
  Filled 2018-01-11: qty 50

## 2018-01-11 MED ORDER — SODIUM CHLORIDE 0.9 % IV SOLN
INTRAVENOUS | Status: DC
  Administered 2018-01-11: 1000 mL via INTRAVENOUS

## 2018-01-11 MED ORDER — PROPOFOL 500 MG/50ML IV EMUL
INTRAVENOUS | Status: DC | PRN
  Administered 2018-01-11: 150 ug/kg/min via INTRAVENOUS

## 2018-01-11 NOTE — Anesthesia Post-op Follow-up Note (Signed)
Anesthesia QCDR form completed.        

## 2018-01-11 NOTE — Anesthesia Preprocedure Evaluation (Signed)
Anesthesia Evaluation  Patient identified by MRN, date of birth, ID band Patient awake    Reviewed: Allergy & Precautions, H&P , NPO status , Patient's Chart, lab work & pertinent test results, reviewed documented beta blocker date and time   History of Anesthesia Complications Negative for: history of anesthetic complications  Airway Mallampati: III  TM Distance: >3 FB Neck ROM: full    Dental  (+) Dental Advidsory Given, Edentulous Upper, Edentulous Lower   Pulmonary shortness of breath and with exertion, asthma , neg sleep apnea, COPD,  COPD inhaler, Recent URI , Residual Cough, Current Smoker,           Cardiovascular Exercise Tolerance: Good hypertension, (-) angina+ DVT  (-) CAD, (-) Past MI, (-) Cardiac Stents and (-) CABG (-) dysrhythmias (-) Valvular Problems/Murmurs     Neuro/Psych neg Seizures PSYCHIATRIC DISORDERS Anxiety Depression  Neuromuscular disease (Bell's Palsy)    GI/Hepatic Neg liver ROS, hiatal hernia, GERD  ,  Endo/Other  negative endocrine ROS  Renal/GU negative Renal ROS  negative genitourinary   Musculoskeletal   Abdominal   Peds  Hematology negative hematology ROS (+)   Anesthesia Other Findings Past Medical History: No date: Anxiety No date: Asthma No date: Asthma No date: Bell's palsy No date: Chronic otitis externa No date: Degenerative disc disease, cervical     Comment:  metal plates in neck No date: Depression No date: DVT (deep venous thrombosis) ([facility]) No date: Fx ankle, right, closed, initial encounter No date: Hiatal hernia No date: High cholesterol No date: Hypertension No date: Insomnia No date: Unsp fracture of unsp forearm, init for opn fx type I/2 No date: Unsp fracture of unsp lower leg, init for opn fx type I/2   Reproductive/Obstetrics negative OB ROS                             Anesthesia Physical Anesthesia Plan  ASA:  III  Anesthesia Plan: General   Post-op Pain Management:    Induction: Intravenous  PONV Risk Score and Plan: 1 and Propofol infusion  Airway Management Planned: Nasal Cannula  Additional Equipment:   Intra-op Plan:   Post-operative Plan:   Informed Consent: I have reviewed the patients History and Physical, chart, labs and discussed the procedure including the risks, benefits and alternatives for the proposed anesthesia with the patient or authorized representative who has indicated his/her understanding and acceptance.   Dental Advisory Given  Plan Discussed with: Anesthesiologist, CRNA and Surgeon  Anesthesia Plan Comments:         Anesthesia Quick Evaluation

## 2018-01-11 NOTE — H&P (Signed)
Donald Bellows, MD [address], [place], [place], [place], [address] [address] [place], [place], [place], [address] Phone: [phone]  Fax: [phone]  Primary Care Physician:  [place]   Pre-Procedure History & Physical: HPI:  Donald Hester is a 67 y.o. male is here for an colonoscopy.   Past Medical History:  Diagnosis Date  . Anxiety   . Asthma   . Asthma   . Bell's palsy   . Chronic otitis externa   . Degenerative disc disease, cervical    metal plates in neck  . Depression   . DVT (deep venous thrombosis) ([place])   . Fx ankle, right, closed, initial encounter   . Hiatal hernia   . High cholesterol   . Hypertension   . Insomnia   . Unsp fracture of unsp forearm, init for opn fx type I/2   . Unsp fracture of unsp lower leg, init for opn fx type I/2     Past Surgical History:  Procedure Laterality Date  . APPENDECTOMY    . BACK SURGERY      Prior to Admission medications   Medication Sig Start Date End Date Taking? Authorizing Provider  apixaban (ELIQUIS) 5 MG TABS tablet Take 10mg  twice daily by mouth for 7 days.  On day 8, decrease dose to 5mg  twice daily by mouth. 04/23/16  Yes Carrie Mew, MD  budesonide-formoterol [facility]) 160-4.5 MCG/ACT inhaler Inhale 2 puffs into the lungs 2 (two) times daily.   Yes [provider]  Calcium Carb-Cholecalciferol (CALCIUM 600-D PO) Take 600 mg by mouth daily.   Yes [provider]  cetirizine (ZYRTEC) 10 MG tablet Take 5 mg by mouth 2 (two) times daily.   Yes [provider]  docusate sodium (COLACE) 100 MG capsule Take 100 mg by mouth 2 (two) times daily.   Yes [provider]  etodolac (LODINE) 400 MG tablet Take 400 mg by mouth 2 (two) times daily.   Yes [provider]  fenofibrate (TRICOR) 145 MG tablet Take 145 mg by mouth daily.   Yes [provider]  lisinopril (PRINIVIL,ZESTRIL) 10 MG tablet Take 5 mg by mouth daily.   Yes  [provider]  methocarbamol (ROBAXIN) 500 MG tablet Take 500 mg by mouth 4 (four) times daily.   Yes [provider]  Multiple Vitamin (MULTIVITAMIN) capsule Take 1 capsule by mouth daily.   Yes [provider]  Omega-3 Fatty Acids (FISH OIL) 1000 MG CAPS Take 1,000 mg by mouth daily.   Yes [provider]  tiotropium (SPIRIVA) 18 MCG inhalation capsule Place 18 mcg into inhaler and inhale daily.   Yes [provider]  albuterol (PROVENTIL HFA;VENTOLIN HFA) 108 (90 Base) MCG/ACT inhaler Inhale 1 puff into the lungs every 6 (six) hours as needed for wheezing or shortness of breath.    [provider]    Allergies as of 11/23/2017 - Review Complete 11/23/2017  Allergen Reaction Noted  . Dye fdc red [red dye] Anaphylaxis 04/23/2016  . Iodinated diagnostic agents Swelling 04/23/2016  . Flunisolide  11/23/2017  . Morphine and related  11/23/2017  . Oxycodone  11/23/2017  . Bee venom  11/23/2017  . Benadryl [diphenhydramine]  11/23/2017  . Hctz [hydrochlorothiazide]  11/23/2017    Family History  Problem Relation Age of Onset  . Diabetes Mother   . CAD Mother   . Arthritis Mother   . Breast cancer Mother   . Asthma Mother   .  Memory loss Mother   . Heart attack Father   . Peripheral vascular disease Father   . Diabetes Maternal Grandmother   . Lupus Maternal Grandmother   . Diabetes Paternal Grandmother   . Deep vein thrombosis Son     Social History   Socioeconomic History  . Marital status: Married    Spouse name: Not on file  . Number of children: 2  . Years of education: Not on file  . Highest education level: Bachelor's degree (e.g., BA, AB, BS)  Occupational History  . Occupation: retired    Comment: Doctor, general practice  Social Needs  . Financial resource strain: Not on file  . Food insecurity:    Worry: Not on file    Inability: Not on file  . Transportation needs:    Medical: Not on file     Non-medical: Not on file  Tobacco Use  . Smoking status: Current Every Day Smoker    Types: Cigarettes  . Smokeless tobacco: Never Used  Substance and Sexual Activity  . Alcohol use: No  . Drug use: No  . Sexual activity: Yes  Lifestyle  . Physical activity:    Days per week: Not on file    Minutes per session: Not on file  . Stress: Not on file  Relationships  . Social connections:    Talks on phone: Not on file    Gets together: Not on file    Attends religious service: Not on file    Active member of club or organization: Not on file    Attends meetings of clubs or organizations: Not on file    Relationship status: Not on file  . Intimate partner violence:    Fear of current or ex partner: Not on file    Emotionally abused: Not on file    Physically abused: Not on file    Forced sexual activity: Not on file  Other Topics Concern  . Not on file  Social History Narrative  . Not on file    Review of Systems: See HPI, otherwise negative ROS  Physical Exam: BP (!) 144/86   Pulse 78   Temp 97.8 F (36.6 C) (Tympanic)   Resp 20   Ht 5\' 10"  (1.778 m)   Wt 270 lb (122.5 kg)   SpO2 98%   BMI 38.74 kg/m  General:   Alert,  pleasant and cooperative in NAD Head:  Normocephalic and atraumatic. Neck:  Supple; no masses or thyromegaly. Lungs:  Clear throughout to auscultation, normal respiratory effort.    Heart:  +S1, +S2, Regular rate and rhythm, No edema. Abdomen:  Soft, nontender and nondistended. Normal bowel sounds, without guarding, and without rebound.   Neurologic:  Alert and  oriented x4;  grossly normal neurologically.  Impression/Plan: Donald Hester is here for an colonoscopy to be performed forPositive FIT test.  Risks, benefits, limitations, and alternatives regarding  colonoscopy have been reviewed with the patient.  Questions have been answered.  All parties agreeable.   Donald Bellows, MD  01/11/2018, 8:04 AM

## 2018-01-11 NOTE — Op Note (Signed)
[facility] Patient Name: Donald Hester Procedure Date: 01/11/2018 7:52 AM MRN: [id number] Account #: [id number] Date of Birth: August 26, 1951 Admit Type: Outpatient Age: 67 Room: [facility] ENDO ROOM 1 Gender: Male Note Status: Finalized Procedure:            Colonoscopy Indications:          Positive fecal immunochemical test Providers:            Jonathon Bellows MD, MD Referring MD:         No Local Md, MD (Referring MD) Medicines:            Monitored Anesthesia Care Complications:        No immediate complications. Procedure:            Pre-Anesthesia Assessment:                       - Prior to the procedure, a History and Physical was                        performed, and patient medications, allergies and                        sensitivities were reviewed. The patient's tolerance of                        previous anesthesia was reviewed.                       - The risks and benefits of the procedure and the                        sedation options and risks were discussed with the                        patient. All questions were answered and informed                        consent was obtained.                       - ASA Grade Assessment: III - A patient with severe                        systemic disease.                       After obtaining informed consent, the colonoscope was                        passed under direct vision. Throughout the procedure,                        the patient's blood pressure, pulse, and oxygen                        saturations were monitored continuously. The                        Colonoscope was introduced through the anus and  advanced to the the cecum, identified by the                        appendiceal orifice, IC valve and transillumination.                        The colonoscopy was performed with ease. The patient                        tolerated the procedure well. The quality of the  bowel                        preparation was poor. Findings:      The perianal and digital rectal examinations were normal.      Five sessile polyps were found in the ascending colon. The polyps were 6       to 10 mm in size. These polyps were removed with a cold snare. Resection       and retrieval were complete. To prevent bleeding after the polypectomy,       one hemostatic clip was successfully placed. There was no bleeding       during, or at the end, of the procedure. The clip was placed on the       polyp which was 10 mm      Two sessile polyps were found in the transverse colon. The polyps were 5       to 7 mm in size. These polyps were removed with a cold snare. Resection       and retrieval were complete.      Three sessile polyps were found in the rectum. The polyps were 5 to 6 mm       in size. These polyps were removed with a cold snare. Resection and       retrieval were complete.      The exam was otherwise without abnormality.      A 15 mm polyp was found in the cecum. The polyp was sessile. Polypectomy       not done due to large qty of stool Impression:           - Preparation of the colon was poor.                       - Five 6 to 10 mm polyps in the ascending colon,                        removed with a cold snare. Resected and retrieved. Clip                        was placed.                       - Two 5 to 7 mm polyps in the transverse colon, removed                        with a cold snare. Resected and retrieved.                       - Three 5 to 6 mm polyps in the rectum, removed with a  cold snare. Resected and retrieved.                       - The examination was otherwise normal.                       - One 15 mm polyp in the cecum. Recommendation:       - Discharge patient to home (with escort).                       - Resume previous diet.                       - Continue present medications.                       - Resume  Eliquis (apixaban) at prior dose tomorrow.                       - Await pathology results.                       - Repeat colonoscopy in 4 weeks because the bowel                        preparation was suboptimal. Procedure Code(s):    --- Professional ---                       [phone], Colonoscopy, flexible; with removal of tumor(s),                        polyp(s), or other lesion(s) by snare technique Diagnosis Code(s):    --- Professional ---                       D12.2, Benign neoplasm of ascending colon                       D12.3, Benign neoplasm of transverse colon (hepatic                        flexure or splenic flexure)                       K62.1, Rectal polyp                       R19.5, Other fecal abnormalities                       D12.0, Benign neoplasm of cecum CPT copyright 2017 American Medical Association. All rights reserved. The codes documented in this report are preliminary and upon coder review may  be revised to meet current compliance requirements. Jonathon Bellows, MD Jonathon Bellows MD, MD 01/11/2018 8:44:20 AM This report has been signed electronically. Number of Addenda: 0 Note Initiated On: 01/11/2018 7:52 AM Scope Withdrawal Time: 0 hours 22 minutes 28 seconds  Total Procedure Duration: 0 hours 30 minutes 12 seconds       [facility]

## 2018-01-11 NOTE — Transfer of Care (Signed)
Immediate Anesthesia Transfer of Care Note  Patient: Donald Hester  Procedure(s) Performed: COLONOSCOPY WITH PROPOFOL (N/A )  Patient Location: PACU  Anesthesia Type:General  Level of Consciousness: awake, alert  and oriented  Airway & Oxygen Therapy: Patient Spontanous Breathing and Patient connected to nasal cannula oxygen  Post-op Assessment: Report given to RN and Post -op Vital signs reviewed and stable  Post vital signs: Reviewed and stable  Last Vitals:  Vitals Value Taken Time  BP    Temp    Pulse    Resp    SpO2      Last Pain:  Vitals:   01/11/18 0730  TempSrc: Tympanic  PainSc: 5       Patients Stated Pain Goal: 0 (04/20/87 7195)  Complications: No apparent anesthesia complications

## 2018-01-11 NOTE — Anesthesia Postprocedure Evaluation (Signed)
Anesthesia Post Note  Patient: Donald Hester  Procedure(s) Performed: COLONOSCOPY WITH PROPOFOL (N/A )  Patient location during evaluation: Endoscopy Anesthesia Type: General Level of consciousness: awake and alert Pain management: pain level controlled Vital Signs Assessment: post-procedure vital signs reviewed and stable Respiratory status: spontaneous breathing, nonlabored ventilation, respiratory function stable and patient connected to nasal cannula oxygen Cardiovascular status: blood pressure returned to baseline and stable Postop Assessment: no apparent nausea or vomiting Anesthetic complications: no     Last Vitals:  Vitals:   01/11/18 0910 01/11/18 0920  BP: (!) 151/80 (!) 145/80  Pulse: 65   Resp: 16 17  Temp:    SpO2: 99% 99%    Last Pain:  Vitals:   01/11/18 0845  TempSrc: Tympanic  PainSc: 0-No pain                 Martha Clan

## 2018-01-11 NOTE — Anesthesia Procedure Notes (Signed)
Performed by: Attallah Ontko, CRNA Pre-anesthesia Checklist: Patient identified, Emergency Drugs available, Suction available, Patient being monitored and Timeout performed Patient Re-evaluated:Patient Re-evaluated prior to induction Oxygen Delivery Method: Nasal cannula Induction Type: IV induction       

## 2018-01-12 ENCOUNTER — Encounter: Payer: Self-pay | Admitting: Gastroenterology

## 2018-01-12 LAB — SURGICAL PATHOLOGY

## 2018-01-13 ENCOUNTER — Other Ambulatory Visit: Payer: Self-pay

## 2018-01-13 ENCOUNTER — Telehealth: Payer: Self-pay

## 2018-01-13 DIAGNOSIS — Z86010 Personal history of colonic polyps: Secondary | ICD-10-CM

## 2018-01-13 NOTE — Telephone Encounter (Signed)
-----   Message from Jonathon Bellows, MD sent at 01/11/2018  8:48 AM EDT ----- Regarding: please arrange appointment   Annissa Andreoni,  Please arrange repeat colonoscopy in 4-6 weeks , poor prep - has a large polyp that needs coming out- preferably schedule on a light day if possible   Regards    Dr Jonathon Bellows  Gastroenterology/Hepatology Pager: [phone]

## 2018-02-09 ENCOUNTER — Other Ambulatory Visit: Payer: Self-pay | Admitting: Internal Medicine

## 2018-02-09 DIAGNOSIS — M75122 Complete rotator cuff tear or rupture of left shoulder, not specified as traumatic: Secondary | ICD-10-CM

## 2018-02-16 ENCOUNTER — Ambulatory Visit
Admission: RE | Admit: 2018-02-16 | Discharge: 2018-02-16 | Disposition: A | Discharge status: Home / Self Care | Payer: Non-veteran care | Source: Ambulatory Visit | Attending: Internal Medicine | Admitting: Internal Medicine

## 2018-02-16 DIAGNOSIS — M75122 Complete rotator cuff tear or rupture of left shoulder, not specified as traumatic: Secondary | ICD-10-CM

## 2018-02-25 ENCOUNTER — Ambulatory Visit: Admission: RE | Admit: 2018-02-25 | Payer: Medicare Other | Source: Ambulatory Visit | Admitting: Gastroenterology

## 2018-02-25 ENCOUNTER — Encounter: Admission: RE | Payer: Self-pay | Source: Ambulatory Visit

## 2018-02-25 SURGERY — COLONOSCOPY WITH PROPOFOL
Anesthesia: General

## 2018-06-09 IMAGING — NM NM PULMONARY VENT & PERF
2 series · 15 of 15 positions shown · non-contrast
Comparison: Chest 04/23/2016

CLINICAL DATA: Chest pain and dyspnea. Pain in the back and leg.
History of prior DVT. Allergic to IV contrast material.

EXAM:
NUCLEAR MEDICINE VENTILATION - PERFUSION LUNG SCAN
TECHNIQUE: Ventilation images were obtained in multiple projections using
inhaled aerosol 0c-22m DTPA. Perfusion images were obtained in
multiple projections after intravenous injection of 0c-22m MAA.
RADIOPHARMACEUTICALS:  32.48 mCi Mechnetium-99m DTPA aerosol
inhalation and 3.66 mCi Mechnetium-99m MAA IV

[Series 1000: lung ventilation · 3.90mm/px · 4 acquisitions, 7 frames shown]
[im 1/4]
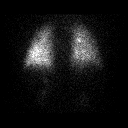
[im 2/4]
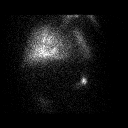
[im 2/4]
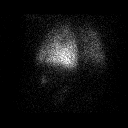
[im 3/4]
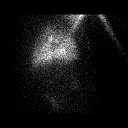
[im 3/4]
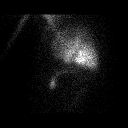
[im 4/4]
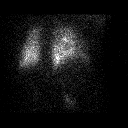
[im 4/4]
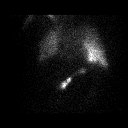

[Series 1000: lung perfusion · 1.95mm/px · 4 acquisitions, 8 frames shown]
[im 1/4]
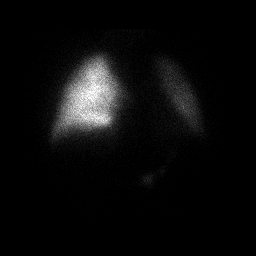
[im 1/4]
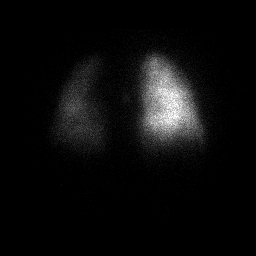
[im 2/4]
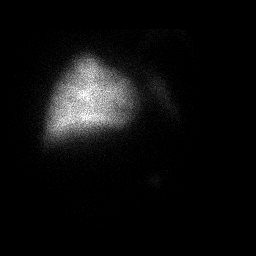
[im 2/4]
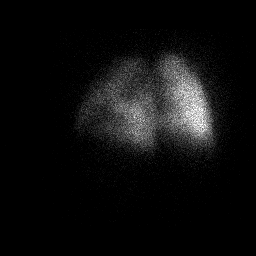
[im 3/4]
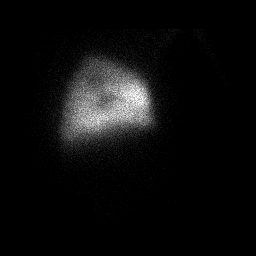
[im 3/4]
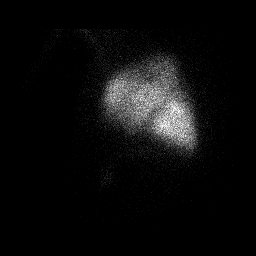
[im 4/4]
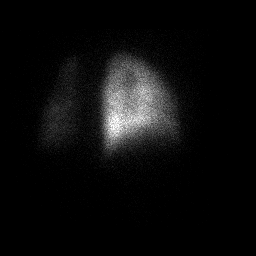
[im 4/4]
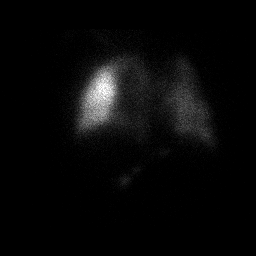

[15 of 15 positions shown; findings below may reference images not displayed]

FINDINGS: Ventilation: No focal ventilation defect.

Perfusion: No wedge shaped peripheral perfusion defects to suggest
acute pulmonary embolism.
IMPRESSION: Very low probability of pulmonary embolus.

## 2019-08-02 ENCOUNTER — Other Ambulatory Visit: Payer: Self-pay

## 2019-08-02 DIAGNOSIS — Z20822 Contact with and (suspected) exposure to covid-19: Secondary | ICD-10-CM

## 2019-08-03 LAB — NOVEL CORONAVIRUS, NAA: SARS-CoV-2, NAA: NOT DETECTED

## 2020-12-24 ENCOUNTER — Other Ambulatory Visit: Payer: Self-pay

## 2020-12-24 ENCOUNTER — Emergency Department
Admission: EM | Admit: 2020-12-24 | Discharge: 2020-12-24 | Disposition: A | Discharge status: Home / Self Care | Payer: Medicare HMO | Attending: Emergency Medicine | Admitting: Emergency Medicine

## 2020-12-24 ENCOUNTER — Encounter: Payer: Self-pay | Admitting: Emergency Medicine

## 2020-12-24 ENCOUNTER — Emergency Department: Payer: Medicare HMO

## 2020-12-24 DIAGNOSIS — J45909 Unspecified asthma, uncomplicated: Secondary | ICD-10-CM | POA: Insufficient documentation

## 2020-12-24 DIAGNOSIS — R1013 Epigastric pain: Secondary | ICD-10-CM | POA: Diagnosis present

## 2020-12-24 DIAGNOSIS — K219 Gastro-esophageal reflux disease without esophagitis: Secondary | ICD-10-CM | POA: Insufficient documentation

## 2020-12-24 DIAGNOSIS — J449 Chronic obstructive pulmonary disease, unspecified: Secondary | ICD-10-CM | POA: Insufficient documentation

## 2020-12-24 DIAGNOSIS — Z79899 Other long term (current) drug therapy: Secondary | ICD-10-CM | POA: Insufficient documentation

## 2020-12-24 DIAGNOSIS — I10 Essential (primary) hypertension: Secondary | ICD-10-CM | POA: Insufficient documentation

## 2020-12-24 DIAGNOSIS — Z7901 Long term (current) use of anticoagulants: Secondary | ICD-10-CM | POA: Diagnosis not present

## 2020-12-24 DIAGNOSIS — F17210 Nicotine dependence, cigarettes, uncomplicated: Secondary | ICD-10-CM | POA: Insufficient documentation

## 2020-12-24 DIAGNOSIS — Z7951 Long term (current) use of inhaled steroids: Secondary | ICD-10-CM | POA: Diagnosis not present

## 2020-12-24 LAB — COMPREHENSIVE METABOLIC PANEL
ALT: 15 U/L (ref 0–44)
AST: 19 U/L (ref 15–41)
Albumin: 3.7 g/dL (ref 3.5–5.0)
Alkaline Phosphatase: 44 U/L (ref 38–126)
Anion gap: 8 (ref 5–15)
BUN: 16 mg/dL (ref 8–23)
CO2: 24 mmol/L (ref 22–32)
Calcium: 9.4 mg/dL (ref 8.9–10.3)
Chloride: 103 mmol/L (ref 98–111)
Creatinine, Ser: 1.09 mg/dL (ref 0.61–1.24)
GFR, Estimated: >60 mL/min (ref >60)
Glucose, Bld: 136 mg/dL — ABNORMAL HIGH (ref 70–99)
Potassium: 4.5 mmol/L (ref 3.5–5.1)
Sodium: 135 mmol/L (ref 135–145)
Total Bilirubin: 0.7 mg/dL (ref 0.3–1.2)
Total Protein: 7 g/dL (ref 6.5–8.1)

## 2020-12-24 LAB — CBC
HCT: 46.6 % (ref 39.0–52.0)
Hemoglobin: 15.6 g/dL (ref 13.0–17.0)
MCH: 31.3 pg (ref 26.0–34.0)
MCHC: 33.5 g/dL (ref 30.0–36.0)
MCV: 93.4 fL (ref 80.0–100.0)
Platelets: 184 10*3/uL (ref 150–400)
RBC: 4.99 MIL/uL (ref 4.22–5.81)
RDW: 13.6 % (ref 11.5–15.5)
WBC: 7.9 10*3/uL (ref 4.0–10.5)
nRBC: 0 % (ref 0.0–0.2)

## 2020-12-24 LAB — TROPONIN I (HIGH SENSITIVITY): Troponin I (High Sensitivity): 5 ng/L (ref <18)

## 2020-12-24 LAB — LIPASE, BLOOD: Lipase: 28 U/L (ref 11–51)

## 2020-12-24 MED ORDER — LIDOCAINE VISCOUS HCL 2 % MT SOLN
15.0000 mL | Freq: Once | OROMUCOSAL | Status: AC
  Administered 2020-12-24: 15 mL via ORAL
  Filled 2020-12-24: qty 15

## 2020-12-24 MED ORDER — ALUM & MAG HYDROXIDE-SIMETH 200-200-20 MG/5ML PO SUSP
30.0000 mL | Freq: Once | ORAL | Status: AC
  Administered 2020-12-24: 30 mL via ORAL
  Filled 2020-12-24: qty 30

## 2020-12-24 NOTE — ED Provider Notes (Signed)
[facility] Emergency Department Provider Note  Time seen: 4:36 PM  I have reviewed the triage vital signs and the nursing notes.   HISTORY  Chief Complaint Chest Pain  HPI Donald Hester is a 70 y.o. male with a past medical history of anxiety, asthma, hypertension, hyperlipidemia, presents to the emergency department for epigastric/lower chest pain.  According to the patient for the past week or so he has been experiencing pain in his central lower chest/epigastrium.  Patient states it only occurs in the evening after eating dinner and last for approximately 2 hours before resolving.  Patient states he ate tonight and had onset of the symptoms so he came to the emergency department for evaluation.  Describes his pain as diminishing currently 5/10 in severity.  Denies any nausea or vomiting.  No diarrhea.  No shortness of breath cough or fever.  Largely negative review of systems.   Past Medical History:  Diagnosis Date  . Anxiety   . Asthma   . Asthma   . Bell's palsy   . Chronic otitis externa   . Degenerative disc disease, cervical    metal plates in neck  . Depression   . DVT (deep venous thrombosis) ([place])   . Fx ankle, right, closed, initial encounter   . Hiatal hernia   . High cholesterol   . Hypertension   . Insomnia   . Unsp fracture of unsp forearm, init for opn fx type I/2   . Unsp fracture of unsp lower leg, init for opn fx type I/2     Patient Active Problem List   Diagnosis Date Noted  . Benign hypertensive heart disease without heart failure 09/15/2016  . Acute deep vein thrombosis of lower limb ([place]) 09/15/2016  . Spinal stenosis of lumbar region 07/22/2015  . Moderate COPD (chronic obstructive pulmonary disease) ([place]) 01/24/2015  . Impotence, organic 04/24/2010  . Allergic rhinitis 04/01/2010  . GERD (gastroesophageal reflux disease) 04/01/2010  . Chronic venous insufficiency 04/17/2008  . Tinnitus 06/25/2007  . Partial hearing loss  06/25/2007  . Hypertensive disorder 06/25/2007  . Obesity 06/25/2007  . Degeneration of cervical intervertebral disc 06/25/2007  . Degeneration of lumbar or lumbosacral intervertebral disc 06/25/2007  . Bell's palsy 06/25/2007  . Chronic otitis media 06/25/2007  . Acute thromboembolism of deep veins of lower extremity ([place]) 06/25/2007    Past Surgical History:  Procedure Laterality Date  . APPENDECTOMY    . BACK SURGERY    . COLONOSCOPY WITH PROPOFOL N/A 01/11/2018   Procedure: COLONOSCOPY WITH PROPOFOL;  Surgeon: Jonathon Bellows, MD;  Location: [facility] ENDOSCOPY;  Service: Gastroenterology;  Laterality: N/A;    Prior to Admission medications   Medication Sig Start Date End Date Taking? Authorizing Provider  albuterol (PROVENTIL HFA;VENTOLIN HFA) 108 (90 Base) MCG/ACT inhaler Inhale 1 puff into the lungs every 6 (six) hours as needed for wheezing or shortness of breath.    [provider]  apixaban (ELIQUIS) 5 MG TABS tablet Take 10mg  twice daily by mouth for 7 days.  On day 8, decrease dose to 5mg  twice daily by mouth. 04/23/16   Carrie Mew, MD  budesonide-formoterol [facility]) 160-4.5 MCG/ACT inhaler Inhale 2 puffs into the lungs 2 (two) times daily.    [provider]  Calcium Carb-Cholecalciferol (CALCIUM 600-D PO) Take 600 mg by mouth daily.    [provider]  cetirizine (ZYRTEC) 10 MG tablet Take 5 mg by mouth 2 (two) times daily.    [provider]  docusate sodium (COLACE) 100 MG capsule Take 100 mg by mouth 2 (two) times daily.    [provider]  etodolac (LODINE) 400 MG tablet Take 400 mg by mouth 2 (two) times daily.    [provider]  fenofibrate (TRICOR) 145 MG tablet Take 145 mg by mouth daily.    [provider]  lisinopril (PRINIVIL,ZESTRIL) 10 MG tablet Take 5 mg by mouth daily.    [provider]  methocarbamol (ROBAXIN) 500 MG tablet Take 500 mg by mouth 4 (four) times daily.    [provider]  Multiple Vitamin (MULTIVITAMIN) capsule Take 1 capsule by mouth daily.    [provider]  Omega-3 Fatty Acids (FISH OIL) 1000 MG CAPS Take 1,000 mg by mouth daily.    [provider]  tiotropium (SPIRIVA) 18 MCG inhalation capsule Place 18 mcg into inhaler and inhale daily.    [provider]    Allergies  Allergen Reactions  . Dye Fdc Red [Red Dye] Anaphylaxis  . Iodinated Diagnostic Agents Swelling    Patient is unsure if he is allergic to Iodinated contrast or MRI contrast.  . Flunisolide   . Morphine And Related   . Oxycodone   . Bee Venom   . Benadryl [Diphenhydramine]   . Hctz [Hydrochlorothiazide]     Family History  Problem Relation Age of Onset  . Diabetes Mother   . CAD Mother   . Arthritis Mother   . Breast cancer Mother   . Asthma Mother   . Memory loss Mother   . Heart attack Father   . Peripheral vascular disease Father   . Diabetes Maternal Grandmother   . Lupus Maternal Grandmother   . Diabetes Paternal Grandmother   . Deep vein thrombosis Son     Social History Social History   Tobacco Use  . Smoking status: Current Every Day Smoker    Types: Cigarettes  . Smokeless tobacco: Never Used  Vaping Use  . Vaping Use: Never used  Substance Use Topics  . Alcohol use: No  . Drug use: No    Review of Systems Constitutional: Negative for fever. Cardiovascular: Mild to moderate lower central chest pain Respiratory: Negative for shortness of breath. Gastrointestinal: Moderate epigastric pain/lower chest pain negative for nausea vomiting or diarrhea Musculoskeletal: Negative for musculoskeletal complaints Neurological: Negative for headache All other ROS negative  ____________________________________________   PHYSICAL EXAM:  VITAL SIGNS: ED Triage Vitals  Enc Vitals Group     BP 12/24/20 1618 (!) 180/81     Pulse Rate 12/24/20 1618 76     Resp 12/24/20 1618 20     Temp 12/24/20 1618 98.2 F (36.8 C)      Temp Source 12/24/20 1618 Oral     SpO2 12/24/20 1618 96 %     Weight 12/24/20 1616 242 lb (109.8 kg)     Height 12/24/20 1616 5\' 11"  (1.803 m)     Head Circumference --      Peak Flow --      Pain Score 12/24/20 1615 8     Pain Loc --      Pain Edu? --      Excl. in [place]? --    Constitutional: Alert and oriented. Well appearing and in no distress. Eyes: Normal exam ENT      Head: Normocephalic and atraumatic.      Mouth/Throat: Mucous membranes are moist. Cardiovascular: Normal rate, regular rhythm. Respiratory: Normal respiratory effort without tachypnea nor retractions.  Breath sounds are clear Gastrointestinal: Soft, mild epigastric tenderness without rebound guarding or distention. Musculoskeletal: Nontender with normal range of motion in all extremities.  Neurologic:  Normal speech and language. No gross focal neurologic deficits  Skin:  Skin is warm, dry and intact.  Psychiatric: Mood and affect are normal.  ____________________________________________    EKG  EKG viewed and interpreted by myself shows a normal sinus rhythm at 73 bpm with a narrow QRS, normal axis, normal intervals, no concerning ST changes.  ____________________________________________    RADIOLOGY  Chest x-ray is negative  ____________________________________________   INITIAL IMPRESSION / ASSESSMENT AND PLAN / ED COURSE  Pertinent labs & imaging results that were available during my care of the patient were reviewed by me and considered in my medical decision making (see chart for details).   Patient presents to the emergency department for central lower chest/epigastric discomfort intermittent over the past 1 week occurring mostly after eating food in the evenings only.  Son believes symptoms could be reflux related.  Patient states his doctor recently cut him down from an acids every day to every other day.  We will dose a GI cocktail to see if this resolves the patient's symptoms.  We will  check labs including cardiac enzymes and lipase and LFTs to help rule out gallbladder dysfunction, pancreatitis or ACS.  Patient agreeable to plan of care.  Patient's work-up is essentially negative.  Troponin negative.  Lipase and LFTs are within normal limits.  EKG is reassuring.  Chest x-ray is negative.  Patient states complete resolution of discomfort following GI cocktail.  Highly suspect reflux related discomfort.  I discussed with the patient restarting his Protonix once daily instead of every other day.  Patient agreeable and will follow up with his doctor.  Gill Delrossi Bunten was evaluated in Emergency Department on 12/24/2020 for the symptoms described in the history of present illness. He was evaluated in the context of the global COVID-19 pandemic, which necessitated consideration that the patient might be at risk for infection with the SARS-CoV-2 virus that causes COVID-19. Institutional protocols and algorithms that pertain to the evaluation of patients at risk for COVID-19 are in a state of rapid change based on information released by regulatory bodies including the CDC and federal and state organizations. These policies and algorithms were followed during the patient's care in the ED.  ____________________________________________   FINAL CLINICAL IMPRESSION(S) / ED DIAGNOSES  Epigastric pain Gastric reflux   Harvest Dark, MD 12/24/20 1825

## 2020-12-24 NOTE — ED Triage Notes (Signed)
Pt to ED via POV with c/o substernal CP/mid upper abd pain that has been intermittent x several weeks. Pt states pain after he eats. Pt denies radiation. Pt states pain is like a constant/sharp pain. Pt states hx of smoking, also states hx of hiatal hernia.

## 2021-09-26 DIAGNOSIS — H43813 Vitreous degeneration, bilateral: Secondary | ICD-10-CM | POA: Diagnosis not present

## 2021-09-26 DIAGNOSIS — E119 Type 2 diabetes mellitus without complications: Secondary | ICD-10-CM | POA: Diagnosis not present

## 2021-10-20 DIAGNOSIS — E1140 Type 2 diabetes mellitus with diabetic neuropathy, unspecified: Secondary | ICD-10-CM | POA: Diagnosis not present

## 2021-10-20 DIAGNOSIS — L600 Ingrowing nail: Secondary | ICD-10-CM | POA: Diagnosis not present

## 2021-10-20 DIAGNOSIS — L97521 Non-pressure chronic ulcer of other part of left foot limited to breakdown of skin: Secondary | ICD-10-CM | POA: Diagnosis not present

## 2023-01-01 ENCOUNTER — Emergency Department
Admission: EM | Admit: 2023-01-01 | Discharge: 2023-01-01 | Disposition: A | Discharge status: Home / Self Care | Payer: Medicare PPO | Attending: Emergency Medicine | Admitting: Emergency Medicine

## 2023-01-01 DIAGNOSIS — I10 Essential (primary) hypertension: Secondary | ICD-10-CM | POA: Diagnosis present

## 2023-01-01 DIAGNOSIS — H6091 Unspecified otitis externa, right ear: Secondary | ICD-10-CM | POA: Insufficient documentation

## 2023-01-01 MED ORDER — CIPRO HC 0.2-1 % OT SUSP: 4.0000 [drp] | Freq: Two times a day (BID) | OTIC | 0 refills | Status: AC

## 2023-01-01 MED ORDER — LISINOPRIL 40 MG PO TABS: 40.0000 mg | ORAL_TABLET | Freq: Every day | ORAL | 1 refills | Status: AC

## 2023-01-01 NOTE — ED Triage Notes (Signed)
Pt sts that he was told to increase his lisinopril to 20mg . Pt sts that he has been doing it for two weeks and has not been able to get his BP lower than 160 sys. [facility]

## 2023-01-01 NOTE — ED Provider Notes (Signed)
[facility] Provider Note    Event Date/Time   First MD Initiated Contact with Patient 01/01/23 1604     (approximate)   History   Hypertension   HPI  Donald Hester is a 72 y.o. male who has a history of high blood pressure who presents with complaints of hypertension.  Patient reports that 2 weeks ago his doctor increased his lisinopril from 10 mg to 20 mg.  He reports that his blood pressure is typically in the 170s even still.  He denies chest pain, no shortness of breath.  He does report some drainage from his right ear that started today.  He does wear hearing aids.  Overall he feels well     Physical Exam   Triage Vital Signs: ED Triage Vitals  Enc Vitals Group     BP 01/01/23 1513 (!) 167/91     Pulse Rate 01/01/23 1513 72     Resp 01/01/23 1513 17     Temp 01/01/23 1513 97.8 F (36.6 C)     Temp Source 01/01/23 1513 Oral     SpO2 01/01/23 1513 97 %     Weight 01/01/23 1514 111.1 kg (245 lb)     Height --      Head Circumference --      Peak Flow --      Pain Score 01/01/23 1514 0     Pain Loc --      Pain Edu? --      Excl. in GC? --     Most recent vital signs: Vitals:   01/01/23 1513 01/01/23 1633  BP: (!) 167/91 (!) 153/84  Pulse: 72 70  Resp: 17 16  Temp: 97.8 F (36.6 C)   SpO2: 97% 98%     General: Awake, no distress.  CV:  Good peripheral perfusion.  Regular rate and rhythm Resp:  Normal effort.  Clear to auscultation bilaterally Abd:  No distention.  Other:  Right ear: Mild otitis externa   ED Results / Procedures / Treatments   Labs (all labs ordered are listed, but only abnormal results are displayed) Labs Reviewed - No data to display   EKG     RADIOLOGY     PROCEDURES:  Critical Care performed:   Procedures   MEDICATIONS ORDERED IN ED: Medications - No data to display   IMPRESSION / MDM / ASSESSMENT AND PLAN / ED COURSE  I reviewed the triage vital signs and the nursing  notes. Patient's presentation is most consistent with exacerbation of chronic illness.  Patient with a history of high blood pressure presents with elevated blood pressures.  He is well-appearing and asymptomatic here in the emergency department.  Blood pressures have been in the 160s and 170s systolic.  He is on 20 mg of lisinopril, we will increase him to 40 mg of lisinopril, I have asked him to follow-up with his PCP in about 1 to 2 weeks if further adjustments are necessary  Cipro otic prescribed for likely otitis externa, mild        FINAL CLINICAL IMPRESSION(S) / ED DIAGNOSES   Final diagnoses:  Primary hypertension     Rx / DC Orders   ED Discharge Orders          Ordered    lisinopril (ZESTRIL) 40 MG tablet  Daily        01/01/23 1622    ciprofloxacin-hydrocortisone (CIPRO HC) OTIC suspension  2 times daily  01/01/23 1622             Note:  This document was prepared using Dragon voice recognition software and may include unintentional dictation errors.   Jene Every, MD 01/01/23 [phone]

## 2023-02-09 IMAGING — CR DG CHEST 2V
2 series · 2 of 2 positions shown · non-contrast
Comparison: 04/23/2016

CLINICAL DATA: Chest pain

EXAM:
CHEST - 2 VIEW

[chest pa]
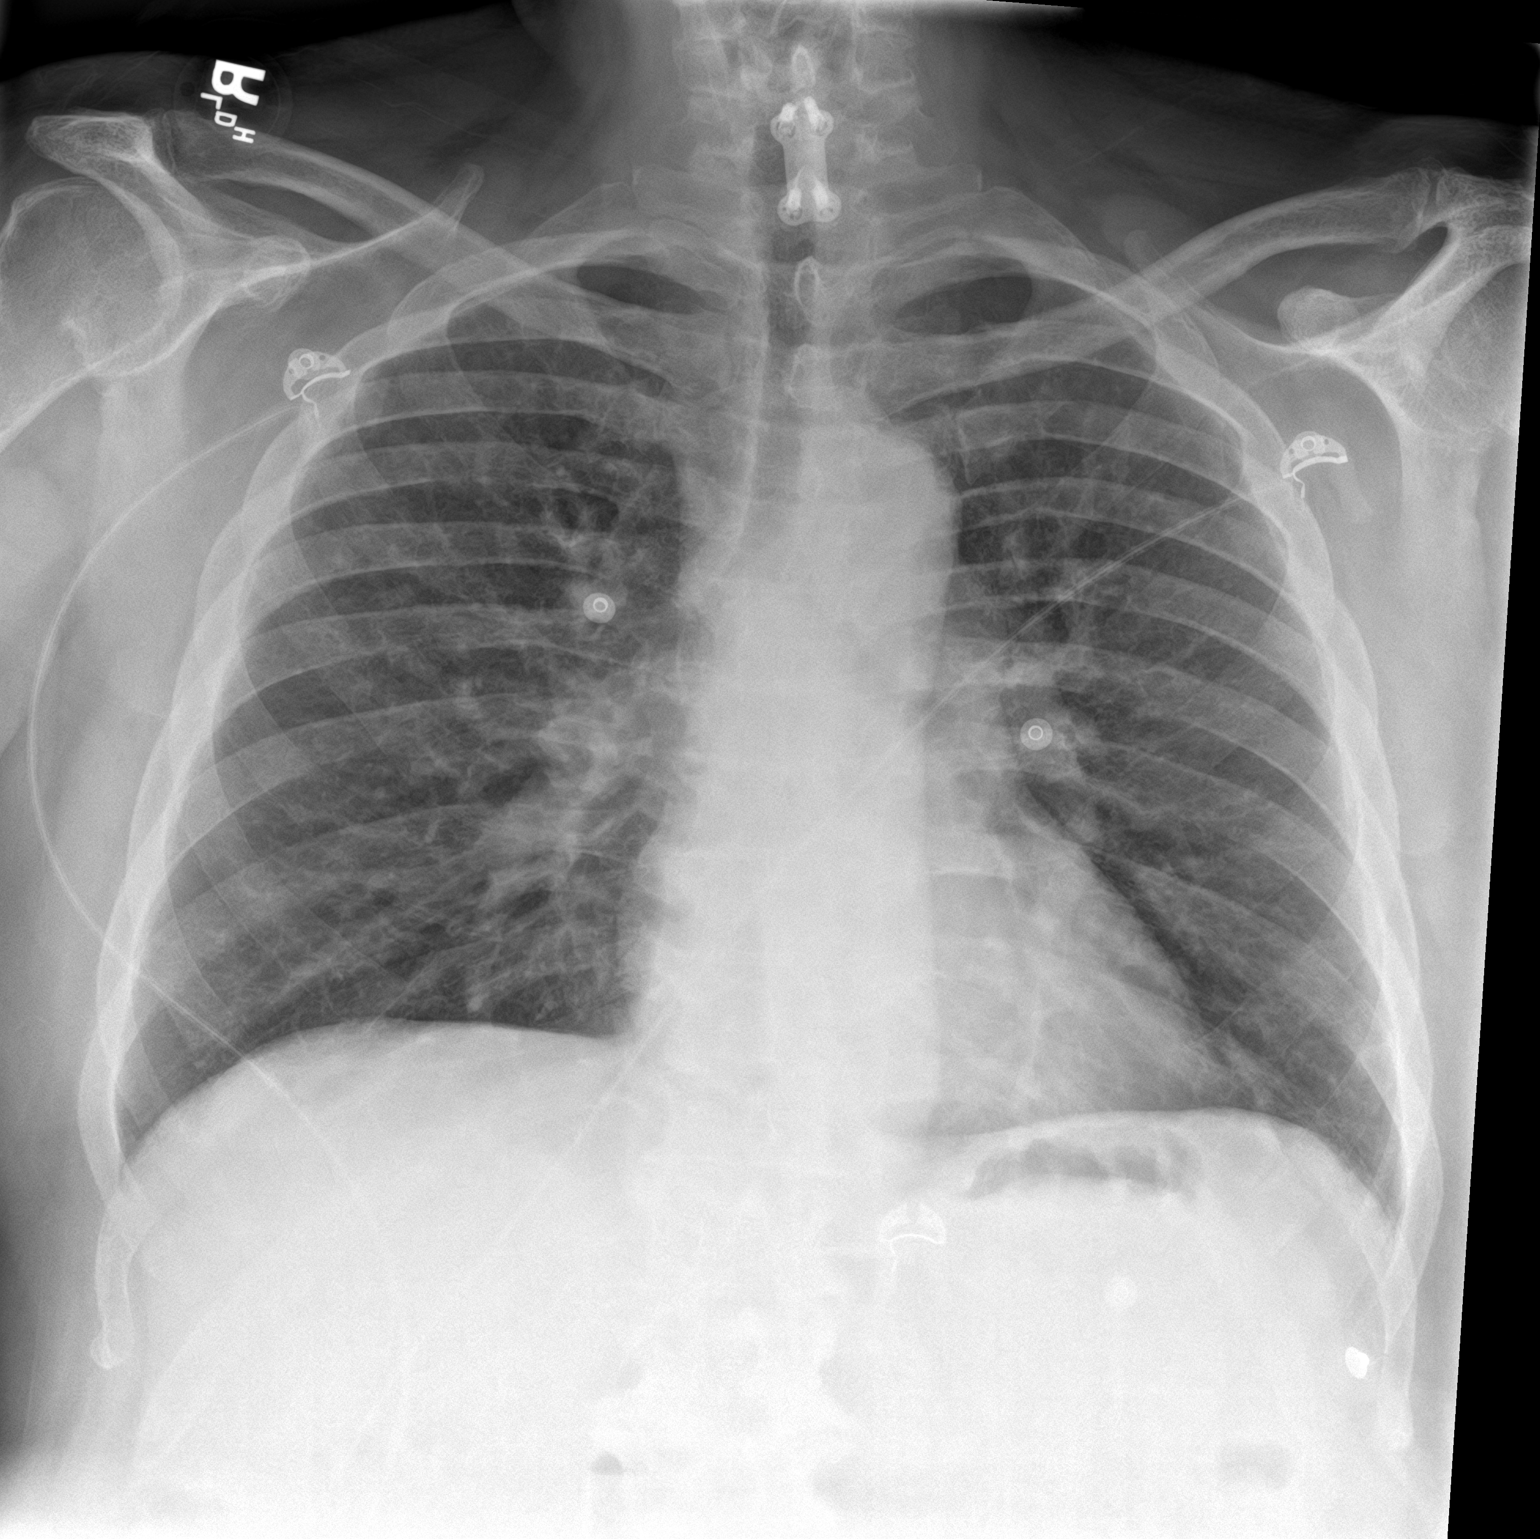

[chest lat]
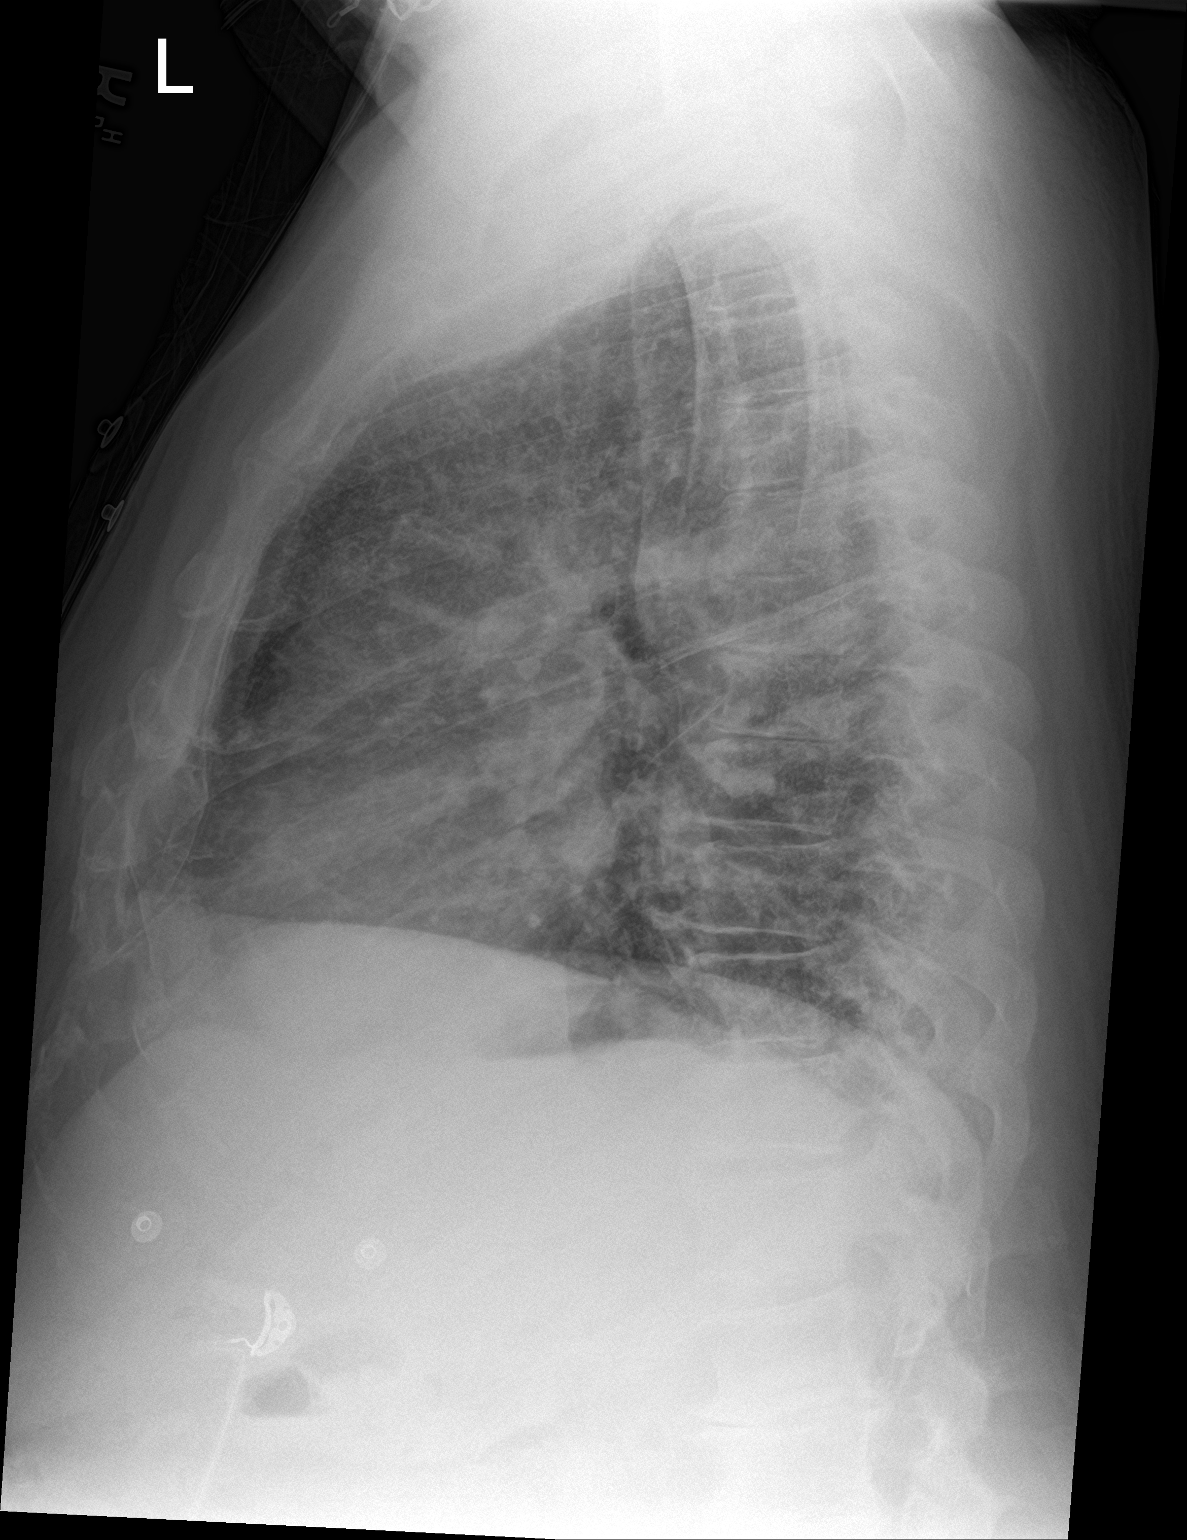

[2 of 2 positions shown; findings below may reference images not displayed]

FINDINGS: Surgical hardware in the cervical spine. No focal opacity or pleural
effusion. There are mild chronic bronchitic changes. Stable
cardiomediastinal silhouette. No pneumothorax
IMPRESSION: No active cardiopulmonary disease.

## 2023-06-05 ENCOUNTER — Emergency Department (HOSPITAL_COMMUNITY): Payer: Medicare PPO

## 2023-06-05 ENCOUNTER — Other Ambulatory Visit: Payer: Self-pay

## 2023-06-05 ENCOUNTER — Inpatient Hospital Stay (HOSPITAL_COMMUNITY)
Admission: EM | Admit: 2023-06-05 | Discharge: 2023-06-07 | DRG: 202 | Disposition: A | Discharge status: Home / Self Care | Payer: Medicare PPO | Attending: Family Medicine | Admitting: Family Medicine

## 2023-06-05 ENCOUNTER — Encounter (HOSPITAL_COMMUNITY): Payer: Self-pay

## 2023-06-05 DIAGNOSIS — Z885 Allergy status to narcotic agent status: Secondary | ICD-10-CM

## 2023-06-05 DIAGNOSIS — J449 Chronic obstructive pulmonary disease, unspecified: Secondary | ICD-10-CM | POA: Diagnosis present

## 2023-06-05 DIAGNOSIS — J111 Influenza due to unidentified influenza virus with other respiratory manifestations: Secondary | ICD-10-CM | POA: Diagnosis present

## 2023-06-05 DIAGNOSIS — J40 Bronchitis, not specified as acute or chronic: Secondary | ICD-10-CM | POA: Diagnosis not present

## 2023-06-05 DIAGNOSIS — F17210 Nicotine dependence, cigarettes, uncomplicated: Secondary | ICD-10-CM | POA: Diagnosis present

## 2023-06-05 DIAGNOSIS — Z888 Allergy status to other drugs, medicaments and biological substances status: Secondary | ICD-10-CM

## 2023-06-05 DIAGNOSIS — R0902 Hypoxemia: Secondary | ICD-10-CM | POA: Diagnosis not present

## 2023-06-05 DIAGNOSIS — Z833 Family history of diabetes mellitus: Secondary | ICD-10-CM

## 2023-06-05 DIAGNOSIS — J4489 Other specified chronic obstructive pulmonary disease: Secondary | ICD-10-CM | POA: Diagnosis present

## 2023-06-05 DIAGNOSIS — E119 Type 2 diabetes mellitus without complications: Secondary | ICD-10-CM | POA: Diagnosis present

## 2023-06-05 DIAGNOSIS — I10 Essential (primary) hypertension: Secondary | ICD-10-CM | POA: Diagnosis present

## 2023-06-05 DIAGNOSIS — Z803 Family history of malignant neoplasm of breast: Secondary | ICD-10-CM

## 2023-06-05 DIAGNOSIS — Z86718 Personal history of other venous thrombosis and embolism: Secondary | ICD-10-CM

## 2023-06-05 DIAGNOSIS — Z79899 Other long term (current) drug therapy: Secondary | ICD-10-CM

## 2023-06-05 DIAGNOSIS — Z8249 Family history of ischemic heart disease and other diseases of the circulatory system: Secondary | ICD-10-CM

## 2023-06-05 DIAGNOSIS — E7800 Pure hypercholesterolemia, unspecified: Secondary | ICD-10-CM | POA: Diagnosis present

## 2023-06-05 DIAGNOSIS — R6510 Systemic inflammatory response syndrome (SIRS) of non-infectious origin without acute organ dysfunction: Secondary | ICD-10-CM

## 2023-06-05 DIAGNOSIS — F32A Depression, unspecified: Secondary | ICD-10-CM | POA: Diagnosis present

## 2023-06-05 DIAGNOSIS — F419 Anxiety disorder, unspecified: Secondary | ICD-10-CM | POA: Diagnosis present

## 2023-06-05 DIAGNOSIS — Z91041 Radiographic dye allergy status: Secondary | ICD-10-CM

## 2023-06-05 DIAGNOSIS — Z7901 Long term (current) use of anticoagulants: Secondary | ICD-10-CM

## 2023-06-05 DIAGNOSIS — Z91030 Bee allergy status: Secondary | ICD-10-CM

## 2023-06-05 DIAGNOSIS — Z8261 Family history of arthritis: Secondary | ICD-10-CM

## 2023-06-05 DIAGNOSIS — Z1152 Encounter for screening for COVID-19: Secondary | ICD-10-CM

## 2023-06-05 DIAGNOSIS — Z7951 Long term (current) use of inhaled steroids: Secondary | ICD-10-CM

## 2023-06-05 DIAGNOSIS — J9601 Acute respiratory failure with hypoxia: Secondary | ICD-10-CM | POA: Diagnosis present

## 2023-06-05 DIAGNOSIS — Z7984 Long term (current) use of oral hypoglycemic drugs: Secondary | ICD-10-CM

## 2023-06-05 DIAGNOSIS — Z825 Family history of asthma and other chronic lower respiratory diseases: Secondary | ICD-10-CM

## 2023-06-05 DIAGNOSIS — R6889 Other general symptoms and signs: Secondary | ICD-10-CM

## 2023-06-05 DIAGNOSIS — Z832 Family history of diseases of the blood and blood-forming organs and certain disorders involving the immune mechanism: Secondary | ICD-10-CM

## 2023-06-05 LAB — COMPREHENSIVE METABOLIC PANEL
ALT: 18 U/L (ref 0–44)
AST: 18 U/L (ref 15–41)
Albumin: 3.7 g/dL (ref 3.5–5.0)
Alkaline Phosphatase: 41 U/L (ref 38–126)
Anion gap: 12 (ref 5–15)
BUN: 19 mg/dL (ref 8–23)
CO2: 23 mmol/L (ref 22–32)
Calcium: 9.7 mg/dL (ref 8.9–10.3)
Chloride: 102 mmol/L (ref 98–111)
Creatinine, Ser: 1.18 mg/dL (ref 0.61–1.24)
GFR, Estimated: >60 mL/min (ref >60)
Glucose, Bld: 167 mg/dL — ABNORMAL HIGH (ref 70–99)
Potassium: 4.3 mmol/L (ref 3.5–5.1)
Sodium: 137 mmol/L (ref 135–145)
Total Bilirubin: 0.6 mg/dL (ref 0.3–1.2)
Total Protein: 7.5 g/dL (ref 6.5–8.1)

## 2023-06-05 LAB — LIPASE, BLOOD: Lipase: 31 U/L (ref 11–51)

## 2023-06-05 LAB — CBC WITH DIFFERENTIAL/PLATELET
Abs Immature Granulocytes: 0.13 10*3/uL — ABNORMAL HIGH (ref 0.00–0.07)
Basophils Absolute: 0.1 10*3/uL (ref 0.0–0.1)
Basophils Relative: 0 %
Eosinophils Absolute: 0 10*3/uL (ref 0.0–0.5)
Eosinophils Relative: 0 %
HCT: 46.8 % (ref 39.0–52.0)
Hemoglobin: 15.2 g/dL (ref 13.0–17.0)
Immature Granulocytes: 1 %
Lymphocytes Relative: 2 %
Lymphs Abs: 0.4 10*3/uL — ABNORMAL LOW (ref 0.7–4.0)
MCH: 31.1 pg (ref 26.0–34.0)
MCHC: 32.5 g/dL (ref 30.0–36.0)
MCV: 95.7 fL (ref 80.0–100.0)
Monocytes Absolute: 1.1 10*3/uL — ABNORMAL HIGH (ref 0.1–1.0)
Monocytes Relative: 6 %
Neutro Abs: 16.9 10*3/uL — ABNORMAL HIGH (ref 1.7–7.7)
Neutrophils Relative %: 91 %
Platelets: 171 10*3/uL (ref 150–400)
RBC: 4.89 MIL/uL (ref 4.22–5.81)
RDW: 13.7 % (ref 11.5–15.5)
WBC: 18.6 10*3/uL — ABNORMAL HIGH (ref 4.0–10.5)
nRBC: 0 % (ref 0.0–0.2)

## 2023-06-05 LAB — I-STAT CG4 LACTIC ACID, ED: Lactic Acid, Venous: 1.9 mmol/L (ref 0.5–1.9)

## 2023-06-05 LAB — PROTIME-INR
INR: 1.1 (ref 0.8–1.2)
Prothrombin Time: 14.3 s (ref 11.4–15.2)

## 2023-06-05 LAB — APTT: aPTT: 31 s (ref 24–36)

## 2023-06-05 MED ORDER — IPRATROPIUM-ALBUTEROL 0.5-2.5 (3) MG/3ML IN SOLN
3.0000 mL | RESPIRATORY_TRACT | Status: AC
  Administered 2023-06-05 (×2): 3 mL via RESPIRATORY_TRACT
  Filled 2023-06-05: qty 6

## 2023-06-05 MED ORDER — IPRATROPIUM-ALBUTEROL 0.5-2.5 (3) MG/3ML IN SOLN
3.0000 mL | Freq: Once | RESPIRATORY_TRACT | Status: AC
  Administered 2023-06-05: 3 mL via RESPIRATORY_TRACT

## 2023-06-05 MED ORDER — PIPERACILLIN-TAZOBACTAM 3.375 G IVPB 30 MIN
3.3750 g | Freq: Once | INTRAVENOUS | Status: AC
  Administered 2023-06-06: 3.375 g via INTRAVENOUS
  Filled 2023-06-05: qty 50

## 2023-06-05 MED ORDER — ACETAMINOPHEN 325 MG PO TABS
325.0000 mg | ORAL_TABLET | Freq: Once | ORAL | Status: AC
  Administered 2023-06-05: 325 mg via ORAL
  Filled 2023-06-05: qty 1

## 2023-06-05 MED ORDER — SODIUM CHLORIDE 0.9 % IV BOLUS
500.0000 mL | Freq: Once | INTRAVENOUS | Status: AC
  Administered 2023-06-06: 500 mL via INTRAVENOUS

## 2023-06-05 NOTE — ED Triage Notes (Signed)
Pt complaining of cough, chills, fever 103.1 abdominal pain, and some chest tightness after cough. All of this started earlier today around 2 pm. Ems gave 650 of tylenol.

## 2023-06-05 NOTE — ED Notes (Signed)
PT ambulated in the unit with assistance, the PT's O2 maintained 87-88 percent while ambulating. The PT stated that he felt off balance while he was walking. The PT stated that he uses a walker to ambulate at home. After the PT walked back to his room, he requested his breathing treatment. MD was made aware.

## 2023-06-05 NOTE — ED Provider Notes (Signed)
[place] EMERGENCY DEPARTMENT AT [facility] Provider Note   CSN: [id number] Arrival date & time: 06/05/23  1901     History  Chief Complaint  Patient presents with   Abdominal Pain    Donald Hester is a 72 y.o. male with past medical history significant for DVT, chronic anticoagulation with Eliquis, hypertension, COPD, hyperlipidemia presents to the ED from home complaining of cough, chills, fever of 103.F, abdominal pain, body aches, and chest tightness.  Patient states his symptoms all began suddenly around 0900.  EMS gave patient 650 mg of Tylenol prior to ED arrival.  He endorses chest discomfort only when coughing and mild shortness of breath.  Denies dizziness, light-headedness, syncope, weakness.         Home Medications Prior to Admission medications   Medication Sig Start Date End Date Taking? Authorizing Provider  albuterol (PROVENTIL HFA;VENTOLIN HFA) 108 (90 Base) MCG/ACT inhaler Inhale 1 puff into the lungs every 6 (six) hours as needed for wheezing or shortness of breath.    [provider]  apixaban (ELIQUIS) 5 MG TABS tablet Take 10mg  twice daily by mouth for 7 days.  On day 8, decrease dose to 5mg  twice daily by mouth. 04/23/16   Sharman Cheek, MD  budesonide-formoterol [facility]) 160-4.5 MCG/ACT inhaler Inhale 2 puffs into the lungs 2 (two) times daily.    [provider]  Calcium Carb-Cholecalciferol (CALCIUM 600-D PO) Take 600 mg by mouth daily.    [provider]  cetirizine (ZYRTEC) 10 MG tablet Take 5 mg by mouth 2 (two) times daily.    [provider]  docusate sodium (COLACE) 100 MG capsule Take 100 mg by mouth 2 (two) times daily.    [provider]  etodolac (LODINE) 400 MG tablet Take 400 mg by mouth 2 (two) times daily.    [provider]  fenofibrate (TRICOR) 145 MG tablet Take 145 mg by mouth daily.    [provider]  lisinopril (ZESTRIL) 40 MG tablet Take 1 tablet (40 mg  total) by mouth daily. 01/01/23 01/31/23  Jene Every, MD  methocarbamol (ROBAXIN) 500 MG tablet Take 500 mg by mouth 4 (four) times daily.    [provider]  Multiple Vitamin (MULTIVITAMIN) capsule Take 1 capsule by mouth daily.    [provider]  Omega-3 Fatty Acids (FISH OIL) 1000 MG CAPS Take 1,000 mg by mouth daily.    [provider]  tiotropium (SPIRIVA) 18 MCG inhalation capsule Place 18 mcg into inhaler and inhale daily.    [provider]      Allergies    Dye fdc red [red dye #40 (allura red)], Iodinated contrast media, Flunisolide, Morphine and codeine, Oxycodone, Bee venom, Benadryl [diphenhydramine], and Hctz [hydrochlorothiazide]    Review of Systems   Review of Systems  Constitutional:  Positive for chills, fatigue and fever.  Respiratory:  Positive for cough, chest tightness and shortness of breath.   Cardiovascular:  Positive for chest pain.  Gastrointestinal:  Positive for abdominal pain. Negative for diarrhea, nausea and vomiting.  Musculoskeletal:  Positive for myalgias.  Neurological:  Negative for dizziness, syncope, weakness and light-headedness.    Physical Exam Updated Vital Signs BP (!) 141/67   Pulse (!) 106   Temp 98.8 F (37.1 C) (Oral)   Resp (!) 21   Ht 5\' 11"  (1.803 m)   Wt 111.1 kg   SpO2 94%   BMI 34.17 kg/m  Physical Exam Vitals and nursing note reviewed.  Constitutional:      General: He is not in acute distress.    Appearance: Normal appearance. He is not ill-appearing or diaphoretic.  Cardiovascular:     Rate and Rhythm: Regular rhythm. Tachycardia present.     Heart sounds: Normal heart sounds.  Pulmonary:     Effort: Pulmonary effort is normal. Tachypnea present. No accessory muscle usage or respiratory distress.     Breath sounds: Normal air entry.  Abdominal:     General: Abdomen is flat.     Palpations: Abdomen is soft.     Tenderness: There is generalized abdominal tenderness (mild).   Skin:    General: Skin is warm and dry.     Capillary Refill: Capillary refill takes less than 2 seconds.  Neurological:     Mental Status: He is alert. Mental status is at baseline.  Psychiatric:        Mood and Affect: Mood normal.        Behavior: Behavior normal.     ED Results / Procedures / Treatments   Labs (all labs ordered are listed, but only abnormal results are displayed) Labs Reviewed  COMPREHENSIVE METABOLIC PANEL - Abnormal; Notable for the following components:      Result Value   Glucose, Bld 167 (*)    All other components within normal limits  CBC WITH DIFFERENTIAL/PLATELET - Abnormal; Notable for the following components:   WBC 18.6 (*)    Neutro Abs 16.9 (*)    Lymphs Abs 0.4 (*)    Monocytes Absolute 1.1 (*)    Abs Immature Granulocytes 0.13 (*)    All other components within normal limits  SARS CORONAVIRUS 2 BY RT PCR  CULTURE, BLOOD (ROUTINE X 2)  CULTURE, BLOOD (ROUTINE X 2)  LIPASE, BLOOD  PROTIME-INR  APTT  URINALYSIS, W/ REFLEX TO CULTURE (INFECTION SUSPECTED)  I-STAT CG4 LACTIC ACID, ED    EKG None  Radiology DG Chest Portable 1 View  Result Date: 06/05/2023 CLINICAL DATA:  Chest tightness EXAM: PORTABLE CHEST 1 VIEW COMPARISON:  12/24/2020 FINDINGS: Heart and mediastinal contours are within normal limits. Peribronchial thickening and diffuse interstitial prominence may reflect bronchitis or chronic lung disease. No acute confluent opacities or effusions. No acute bony abnormality. IMPRESSION: Peribronchial thickening and interstitial prominence could reflect bronchitis or chronic lung disease. Electronically Signed   By: Charlett Nose M.D.   On: 06/05/2023 22:27    Procedures Procedures    Medications Ordered in ED Medications  sodium chloride 0.9 % bolus 500 mL (has no administration in time range)  piperacillin-tazobactam (ZOSYN) IVPB 3.375 g (has no administration in time range)  acetaminophen (TYLENOL) tablet 325 mg (325 mg Oral  Given 06/05/23 2040)  ipratropium-albuterol (DUONEB) 0.5-2.5 (3) MG/3ML nebulizer solution 3 mL (3 mLs Nebulization Given 06/05/23 2322)  ipratropium-albuterol (DUONEB) 0.5-2.5 (3) MG/3ML nebulizer solution 3 mL (3 mLs Nebulization Given 06/05/23 2332)    ED Course/ Medical Decision Making/ A&P                                 Medical Decision Making Amount and/or Complexity of Data Reviewed Labs: ordered. Radiology: ordered. ECG/medicine tests: ordered.  Risk OTC drugs. Prescription drug management.   This patient presents to the ED with chief complaint(s) of flu-like symptoms with pertinent past medical history of COPD, hypertension.  The complaint involves an extensive differential diagnosis and also carries with it a high risk of complications and  morbidity.    The differential diagnosis includes COVID, flu, RSV, adenovirus, other viral etiology, pneumonia, acute bronchitis   The initial plan is to obtain labs, ECG, chest x-ray.  Patient meets SIRS criteria.  Blood cultures ordered.    Initial Assessment:   Exam significant for moderate diffuse expiratory wheezing in all lung fields.  Adequate tidal volume.  Patient speaks in 4-5 word sentences.  SPO2 92-93% during conversation.  Abdomen soft with generalized central tenderness.  Heart rate is mildly elevated at 104 bpm with normal heart sounds.    Independent ECG/labs interpretation:  The following labs were independently interpreted:  CBC with leukocytosis at 18.6.    Independent visualization and interpretation of imaging: I independently visualized the following imaging with scope of interpretation limited to determining acute life threatening conditions related to emergency care: chest x-ray, which revealed peribronchial thickening which may be   Treatment and Reassessment: Informed by RN that patient initially did not wish to receive breathing treatment because he does not feel short of breath.  Patient inquiring about when  he will be discharged.  Discussed with patient importance of treatment due to his oxygen being 93-94% at rest.  He is compliant with COPD inhalers and reports he normally has "worsening wheezing" near bedtime.  Patient agreeable to ambulating while monitoring pulse oximetry.    Patient ambulated and was 87-88%.  Patient agreeable to DuoNeb breathing treatment.    Disposition:   11:58 PM Care transferred to Dr. Madilyn Hook at the end of my shift as the patient will require reassessment once labs/imaging have resulted. Patient presentation, ED course, and plan of care discussed with review of all pertinent labs and imaging. Please see his/her note for further details regarding further ED course and disposition. Plan at time of handoff is follow up on COVID testing, reassess patient following breathing treatments.  This may be altered or completely changed at the discretion of the oncoming team pending results of further workup.           Final Clinical Impression(s) / ED Diagnoses Final diagnoses:  Hypoxia  Flu-like symptoms    Rx / DC Orders ED Discharge Orders     None         Lenard Simmer, PA-C 06/05/23 2359    Rondel Baton, MD 06/08/23 1616

## 2023-06-06 ENCOUNTER — Emergency Department (HOSPITAL_COMMUNITY): Payer: Medicare PPO

## 2023-06-06 DIAGNOSIS — Z885 Allergy status to narcotic agent status: Secondary | ICD-10-CM | POA: Diagnosis not present

## 2023-06-06 DIAGNOSIS — E119 Type 2 diabetes mellitus without complications: Secondary | ICD-10-CM | POA: Diagnosis present

## 2023-06-06 DIAGNOSIS — Z7901 Long term (current) use of anticoagulants: Secondary | ICD-10-CM | POA: Diagnosis not present

## 2023-06-06 DIAGNOSIS — Z79899 Other long term (current) drug therapy: Secondary | ICD-10-CM | POA: Diagnosis not present

## 2023-06-06 DIAGNOSIS — F419 Anxiety disorder, unspecified: Secondary | ICD-10-CM | POA: Diagnosis present

## 2023-06-06 DIAGNOSIS — F17210 Nicotine dependence, cigarettes, uncomplicated: Secondary | ICD-10-CM | POA: Diagnosis present

## 2023-06-06 DIAGNOSIS — J40 Bronchitis, not specified as acute or chronic: Secondary | ICD-10-CM | POA: Diagnosis present

## 2023-06-06 DIAGNOSIS — E7800 Pure hypercholesterolemia, unspecified: Secondary | ICD-10-CM | POA: Diagnosis present

## 2023-06-06 DIAGNOSIS — J9601 Acute respiratory failure with hypoxia: Secondary | ICD-10-CM | POA: Diagnosis present

## 2023-06-06 DIAGNOSIS — Z7984 Long term (current) use of oral hypoglycemic drugs: Secondary | ICD-10-CM | POA: Diagnosis not present

## 2023-06-06 DIAGNOSIS — Z803 Family history of malignant neoplasm of breast: Secondary | ICD-10-CM | POA: Diagnosis not present

## 2023-06-06 DIAGNOSIS — J111 Influenza due to unidentified influenza virus with other respiratory manifestations: Secondary | ICD-10-CM

## 2023-06-06 DIAGNOSIS — Z8261 Family history of arthritis: Secondary | ICD-10-CM | POA: Diagnosis not present

## 2023-06-06 DIAGNOSIS — Z86718 Personal history of other venous thrombosis and embolism: Secondary | ICD-10-CM | POA: Diagnosis not present

## 2023-06-06 DIAGNOSIS — Z888 Allergy status to other drugs, medicaments and biological substances status: Secondary | ICD-10-CM | POA: Diagnosis not present

## 2023-06-06 DIAGNOSIS — Z833 Family history of diabetes mellitus: Secondary | ICD-10-CM | POA: Diagnosis not present

## 2023-06-06 DIAGNOSIS — Z8249 Family history of ischemic heart disease and other diseases of the circulatory system: Secondary | ICD-10-CM | POA: Diagnosis not present

## 2023-06-06 DIAGNOSIS — Z7951 Long term (current) use of inhaled steroids: Secondary | ICD-10-CM | POA: Diagnosis not present

## 2023-06-06 DIAGNOSIS — F32A Depression, unspecified: Secondary | ICD-10-CM | POA: Diagnosis present

## 2023-06-06 DIAGNOSIS — J4489 Other specified chronic obstructive pulmonary disease: Secondary | ICD-10-CM | POA: Diagnosis present

## 2023-06-06 DIAGNOSIS — Z91041 Radiographic dye allergy status: Secondary | ICD-10-CM | POA: Diagnosis not present

## 2023-06-06 DIAGNOSIS — R0902 Hypoxemia: Secondary | ICD-10-CM | POA: Diagnosis present

## 2023-06-06 DIAGNOSIS — I10 Essential (primary) hypertension: Secondary | ICD-10-CM | POA: Diagnosis present

## 2023-06-06 DIAGNOSIS — Z91030 Bee allergy status: Secondary | ICD-10-CM | POA: Diagnosis not present

## 2023-06-06 DIAGNOSIS — Z825 Family history of asthma and other chronic lower respiratory diseases: Secondary | ICD-10-CM | POA: Diagnosis not present

## 2023-06-06 DIAGNOSIS — Z1152 Encounter for screening for COVID-19: Secondary | ICD-10-CM | POA: Diagnosis not present

## 2023-06-06 LAB — RESPIRATORY PANEL BY PCR

## 2023-06-06 LAB — CBC
HCT: 44 % (ref 39.0–52.0)
Hemoglobin: 14.2 g/dL (ref 13.0–17.0)
MCH: 30.9 pg (ref 26.0–34.0)
MCHC: 32.3 g/dL (ref 30.0–36.0)
MCV: 95.9 fL (ref 80.0–100.0)
Platelets: 142 10*3/uL — ABNORMAL LOW (ref 150–400)
RBC: 4.59 MIL/uL (ref 4.22–5.81)
RDW: 14 % (ref 11.5–15.5)
WBC: 18.7 10*3/uL — ABNORMAL HIGH (ref 4.0–10.5)
nRBC: 0 % (ref 0.0–0.2)

## 2023-06-06 LAB — URINALYSIS, W/ REFLEX TO CULTURE (INFECTION SUSPECTED)
Bilirubin Urine: NEGATIVE
Glucose, UA: 150 mg/dL — AB
Hgb urine dipstick: NEGATIVE
Ketones, ur: NEGATIVE mg/dL
Leukocytes,Ua: NEGATIVE
Nitrite: NEGATIVE
Protein, ur: 30 mg/dL — AB
Specific Gravity, Urine: 1.026 (ref 1.005–1.030)
pH: 5 (ref 5.0–8.0)

## 2023-06-06 LAB — HEMOGLOBIN A1C
Hgb A1c MFr Bld: 6.6 % — ABNORMAL HIGH (ref 4.8–5.6)
Mean Plasma Glucose: 142.72 mg/dL

## 2023-06-06 LAB — COMPREHENSIVE METABOLIC PANEL
ALT: 14 U/L (ref 0–44)
AST: 17 U/L (ref 15–41)
Albumin: 3.2 g/dL — ABNORMAL LOW (ref 3.5–5.0)
Alkaline Phosphatase: 33 U/L — ABNORMAL LOW (ref 38–126)
Anion gap: 7 (ref 5–15)
BUN: 17 mg/dL (ref 8–23)
CO2: 23 mmol/L (ref 22–32)
Calcium: 8.9 mg/dL (ref 8.9–10.3)
Chloride: 103 mmol/L (ref 98–111)
Creatinine, Ser: 1.21 mg/dL (ref 0.61–1.24)
GFR, Estimated: >60 mL/min (ref >60)
Glucose, Bld: 136 mg/dL — ABNORMAL HIGH (ref 70–99)
Potassium: 4.3 mmol/L (ref 3.5–5.1)
Sodium: 133 mmol/L — ABNORMAL LOW (ref 135–145)
Total Bilirubin: 0.6 mg/dL (ref 0.3–1.2)
Total Protein: 6.8 g/dL (ref 6.5–8.1)

## 2023-06-06 LAB — GLUCOSE, CAPILLARY
Glucose-Capillary: 153 mg/dL — ABNORMAL HIGH (ref 70–99)
Glucose-Capillary: 209 mg/dL — ABNORMAL HIGH (ref 70–99)
Glucose-Capillary: 129 mg/dL — ABNORMAL HIGH (ref 70–99)
Glucose-Capillary: 122 mg/dL — ABNORMAL HIGH (ref 70–99)

## 2023-06-06 LAB — PROCALCITONIN: Procalcitonin: 5.53 ng/mL

## 2023-06-06 LAB — SARS CORONAVIRUS 2 BY RT PCR: SARS Coronavirus 2 by RT PCR: NEGATIVE

## 2023-06-06 LAB — HIV ANTIBODY (ROUTINE TESTING W REFLEX): HIV Screen 4th Generation wRfx: NONREACTIVE

## 2023-06-06 MED ORDER — UMECLIDINIUM BROMIDE 62.5 MCG/ACT IN AEPB
1.0000 | INHALATION_SPRAY | Freq: Every day | RESPIRATORY_TRACT | Status: DC
  Administered 2023-06-07: 1 via RESPIRATORY_TRACT
  Filled 2023-06-06: qty 7

## 2023-06-06 MED ORDER — VITAMIN D 25 MCG (1000 UNIT) PO TABS
1000.0000 [IU] | ORAL_TABLET | Freq: Every day | ORAL | Status: DC
  Administered 2023-06-06 – 2023-06-07 (×2): 1000 [IU] via ORAL
  Filled 2023-06-06 (×2): qty 1

## 2023-06-06 MED ORDER — FENOFIBRATE 160 MG PO TABS
160.0000 mg | ORAL_TABLET | Freq: Every day | ORAL | Status: DC
  Administered 2023-06-06 – 2023-06-07 (×2): 160 mg via ORAL
  Filled 2023-06-06 (×2): qty 1

## 2023-06-06 MED ORDER — METHOCARBAMOL 500 MG PO TABS
500.0000 mg | ORAL_TABLET | Freq: Four times a day (QID) | ORAL | Status: DC
  Administered 2023-06-06 – 2023-06-07 (×5): 500 mg via ORAL
  Filled 2023-06-06 (×6): qty 1

## 2023-06-06 MED ORDER — ALBUTEROL SULFATE (2.5 MG/3ML) 0.083% IN NEBU: 2.5000 mg | INHALATION_SOLUTION | RESPIRATORY_TRACT | Status: DC | PRN

## 2023-06-06 MED ORDER — GABAPENTIN 300 MG PO CAPS
900.0000 mg | ORAL_CAPSULE | Freq: Three times a day (TID) | ORAL | Status: DC
  Administered 2023-06-06 – 2023-06-07 (×4): 900 mg via ORAL
  Filled 2023-06-06 (×4): qty 3

## 2023-06-06 MED ORDER — MOMETASONE FURO-FORMOTEROL FUM 200-5 MCG/ACT IN AERO
2.0000 | INHALATION_SPRAY | Freq: Two times a day (BID) | RESPIRATORY_TRACT | Status: DC
  Administered 2023-06-06 – 2023-06-07 (×3): 2 via RESPIRATORY_TRACT
  Filled 2023-06-06: qty 8.8

## 2023-06-06 MED ORDER — AMLODIPINE BESYLATE 10 MG PO TABS
10.0000 mg | ORAL_TABLET | Freq: Every day | ORAL | Status: DC
  Administered 2023-06-06 – 2023-06-07 (×2): 10 mg via ORAL
  Filled 2023-06-06 (×2): qty 1

## 2023-06-06 MED ORDER — SODIUM CHLORIDE 0.9 % IV SOLN
1.0000 g | INTRAVENOUS | Status: DC
  Administered 2023-06-06 – 2023-06-07 (×2): 1 g via INTRAVENOUS
  Filled 2023-06-06 (×2): qty 10

## 2023-06-06 MED ORDER — ONDANSETRON HCL 4 MG/2ML IJ SOLN: 4.0000 mg | Freq: Four times a day (QID) | INTRAMUSCULAR | Status: DC | PRN

## 2023-06-06 MED ORDER — ACETAMINOPHEN 325 MG PO TABS
650.0000 mg | ORAL_TABLET | Freq: Four times a day (QID) | ORAL | Status: DC | PRN
  Administered 2023-06-06: 650 mg via ORAL
  Filled 2023-06-06: qty 2

## 2023-06-06 MED ORDER — APIXABAN 5 MG PO TABS
5.0000 mg | ORAL_TABLET | Freq: Two times a day (BID) | ORAL | Status: DC
  Administered 2023-06-06 – 2023-06-07 (×3): 5 mg via ORAL
  Filled 2023-06-06 (×3): qty 1

## 2023-06-06 MED ORDER — ADULT MULTIVITAMIN W/MINERALS CH
1.0000 | ORAL_TABLET | Freq: Every day | ORAL | Status: DC
  Administered 2023-06-06 – 2023-06-07 (×2): 1 via ORAL
  Filled 2023-06-06 (×2): qty 1

## 2023-06-06 MED ORDER — VITAMIN B-12 1000 MCG PO TABS
1000.0000 ug | ORAL_TABLET | Freq: Every day | ORAL | Status: DC
  Administered 2023-06-06 – 2023-06-07 (×2): 1000 ug via ORAL
  Filled 2023-06-06 (×2): qty 1

## 2023-06-06 MED ORDER — INSULIN ASPART 100 UNIT/ML IJ SOLN
0.0000 [IU] | Freq: Three times a day (TID) | INTRAMUSCULAR | Status: DC
  Administered 2023-06-06: 2 [IU] via SUBCUTANEOUS
  Administered 2023-06-06: 5 [IU] via SUBCUTANEOUS
  Administered 2023-06-06 – 2023-06-07 (×2): 3 [IU] via SUBCUTANEOUS

## 2023-06-06 MED ORDER — LISINOPRIL 20 MG PO TABS
20.0000 mg | ORAL_TABLET | Freq: Every day | ORAL | Status: DC
  Administered 2023-06-06 – 2023-06-07 (×2): 20 mg via ORAL
  Filled 2023-06-06 (×2): qty 1

## 2023-06-06 MED ORDER — LORATADINE 10 MG PO TABS
10.0000 mg | ORAL_TABLET | Freq: Every day | ORAL | Status: DC
  Administered 2023-06-06 – 2023-06-07 (×2): 10 mg via ORAL
  Filled 2023-06-06 (×2): qty 1

## 2023-06-06 MED ORDER — ALBUTEROL SULFATE HFA 108 (90 BASE) MCG/ACT IN AERS: 1.0000 | INHALATION_SPRAY | Freq: Four times a day (QID) | RESPIRATORY_TRACT | Status: DC | PRN

## 2023-06-06 MED ORDER — CITALOPRAM HYDROBROMIDE 20 MG PO TABS
40.0000 mg | ORAL_TABLET | Freq: Every day | ORAL | Status: DC
  Administered 2023-06-06 – 2023-06-07 (×2): 40 mg via ORAL
  Filled 2023-06-06 (×2): qty 2

## 2023-06-06 MED ORDER — ATORVASTATIN CALCIUM 10 MG PO TABS
20.0000 mg | ORAL_TABLET | Freq: Every day | ORAL | Status: DC
  Administered 2023-06-06 – 2023-06-07 (×2): 20 mg via ORAL
  Filled 2023-06-06 (×2): qty 2

## 2023-06-06 NOTE — Assessment & Plan Note (Signed)
Patient has acute respiratory failure with hypoxia due to having a new oxygen requirement.  That is the patient has a PaO2 < 60 (pulse Ox < 90%) on room air. O2 via [place]

## 2023-06-06 NOTE — Assessment & Plan Note (Signed)
Cont chronic home eliquis.

## 2023-06-06 NOTE — Assessment & Plan Note (Signed)
Cont home BP meds ?

## 2023-06-06 NOTE — Progress Notes (Signed)
Incentive spirometry introduced with teach back 

## 2023-06-06 NOTE — Care Plan (Signed)
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  Patient was febrile on arrival in the ED.  Patient was given Tylenol.  Patient is admitted for viral illness. COVID negative, respiratory viral panel negative, influenza positive.  Patient is started on Tamiflu.  Patient was seen and examined at bedside reports feeling better.

## 2023-06-06 NOTE — Assessment & Plan Note (Addendum)
Pt with ILI symptoms, COVID-19 neg, RVP pending to determine if influenza present (havent seen much of it yet this year). COPD pathway for bronchitis Check RVP, illness sounds very much like Influenza. Start tamiflu if influenza confirmed. Scheduled LABA, LAMA, and Inhaled steroid PRN SABA Got dose of zosyn in ED for fever + WBC Will leave on rocephin for the moment Procalcitonin pending Cont pulse ox Not really wheezing, will hold off on systemic steroid for the moment, especially with question of influenza.

## 2023-06-06 NOTE — H&P (Addendum)
History and Physical    Patient: Donald Hester ZOX:[id number] DOB: 29-Aug-1951 DOA: 06/05/2023 DOS: the patient was seen and examined on 06/06/2023 PCP: [facility]  Patient coming from: Home  Chief Complaint:  Chief Complaint  Patient presents with   Abdominal Pain   HPI: Donald Hester is a 72 y.o. 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  Pt in to ED with 1 day h/o sudden onset fever, chills, body aches, cough, mild SOB, nausea, diarrhea.  Symptoms onset suddenly around 0900, persistent.  EMS gave tylenol prior to ED arrival.  Has objective fever in ED of 102.4   Review of Systems: As mentioned in the history of present illness. All other systems reviewed and are negative. Past Medical History:  Diagnosis Date   Anxiety    Asthma    Asthma    Bell's palsy    Chronic otitis externa    Degenerative disc disease, cervical    metal plates in neck   Depression    DVT (deep venous thrombosis) (HCC)    Fx ankle, right, closed, initial encounter    Hiatal hernia    High cholesterol    Hypertension    Insomnia    Unsp fracture of unsp forearm, init for opn fx type I/2    Unsp fracture of unsp lower leg, init for opn fx type I/2    Past Surgical History:  Procedure Laterality Date   APPENDECTOMY     BACK SURGERY     COLONOSCOPY WITH PROPOFOL N/A 01/11/2018   Procedure: COLONOSCOPY WITH PROPOFOL;  Surgeon: Wyline Mood, MD;  Location: [facility] ENDOSCOPY;  Service: Gastroenterology;  Laterality: N/A;   Social History:  reports that he has been smoking cigarettes. He has never used smokeless tobacco. He reports that he does not drink alcohol and does not use drugs.  Allergies  Allergen Reactions   Bee Venom Anaphylaxis   Dye Fdc Red [Red Dye #40 (Allura Red)] Anaphylaxis   Iodinated Contrast Media Swelling    Patient is unsure if he is allergic to Iodinated contrast or MRI contrast.   Flunisolide     Unknown reaction   Morphine  And Codeine     Goes crazy per late wife   Oxycodone     Anxiety per [facility] record    Family History  Problem Relation Age of Onset   Diabetes Mother    CAD Mother    Arthritis Mother    Breast cancer Mother    Asthma Mother    Memory loss Mother    Heart attack Father    Peripheral vascular disease Father    Diabetes Maternal Grandmother    Lupus Maternal Grandmother    Diabetes Paternal Grandmother    Deep vein thrombosis Son     Prior to Admission medications   Medication Sig Start Date End Date Taking? Authorizing Provider  albuterol (PROVENTIL HFA;VENTOLIN HFA) 108 (90 Base) MCG/ACT inhaler Inhale 1 puff into the lungs every 6 (six) hours as needed for wheezing or shortness of breath.   Yes [provider]  amLODipine (NORVASC) 10 MG tablet Take 10 mg by mouth daily.   Yes [provider]  apixaban (ELIQUIS) 5 MG TABS tablet Take 10mg  twice daily by mouth for 7 days.  On day 8, decrease dose to 5mg  twice daily by mouth. Patient taking differently: Take 5 mg by mouth 2 (two) times daily. 04/23/16  Yes Sharman Cheek, MD  atorvastatin (LIPITOR)  20 MG tablet Take 20 mg by mouth daily.   Yes [provider]  cetirizine (ZYRTEC) 10 MG tablet Take 10 mg by mouth daily.   Yes [provider]  cholecalciferol (VITAMIN D3) 25 MCG (1000 UNIT) tablet Take 1,000 Units by mouth daily.   Yes [provider]  citalopram (CELEXA) 40 MG tablet Take 40 mg by mouth daily.   Yes [provider]  cyanocobalamin (VITAMIN B12) 1000 MCG tablet Take 1,000 mcg by mouth daily.   Yes [provider]  docusate sodium (COLACE) 100 MG capsule Take 100 mg by mouth daily as needed for mild constipation.   Yes [provider]  fenofibrate (TRICOR) 145 MG tablet Take 145 mg by mouth daily.   Yes [provider]  fluticasone (FLONASE) 50 MCG/ACT nasal spray Place 2 sprays into both nostrils 2 (two) times daily as needed for  allergies. 01/01/23  Yes [provider]  fluticasone-salmeterol (ADVAIR) 250-50 MCG/ACT AEPB Inhale 1 puff into the lungs in the morning and at bedtime.   Yes [provider]  gabapentin (NEURONTIN) 300 MG capsule Take 900 mg by mouth 3 (three) times daily.   Yes [provider]  lisinopril (ZESTRIL) 40 MG tablet Take 1 tablet (40 mg total) by mouth daily. Patient taking differently: Take 20 mg by mouth daily. 01/01/23 06/05/24 Yes Jene Every, MD  metFORMIN (GLUCOPHAGE-XR) 500 MG 24 hr tablet Take 500-1,000 mg by mouth See admin instructions. Take 1 tablet by mouth every morning and 2 tablets in the evening 04/09/23  Yes [provider]  methocarbamol (ROBAXIN) 500 MG tablet Take 500 mg by mouth 4 (four) times daily.   Yes [provider]  Multiple Vitamin (MULTIVITAMIN) capsule Take 1 capsule by mouth daily.   Yes [provider]  sodium chloride (OCEAN) 0.65 % nasal spray Place 2 sprays into the nose 2 (two) times daily as needed for congestion. 12/16/22  Yes [provider]    Physical Exam: Vitals:   06/06/23 0130 06/06/23 0145 06/06/23 0400 06/06/23 0432  BP: 132/78 133/82 133/79 (!) 142/67  Pulse: 97 99 97 94  Resp: (!) 25 (!) 23  (!) 22  Temp:    (!) 102.4 F (39.1 C)  TempSrc:    Oral  SpO2: 93% 92% 93% 93%  Weight:      Height:       Constitutional: NAD, calm, comfortable Respiratory: clear to auscultation bilaterally, no wheezing, no crackles. Normal respiratory effort. No accessory muscle use.  Does have cough.  Cardiovascular: Regular rate and rhythm no longer tachycardic at time of my exam, no murmurs / rubs / gallops. No extremity edema. 2+ pedal pulses. No carotid bruits.  Abdomen: no tenderness, no masses palpated. No hepatosplenomegaly. Bowel sounds positive.  Neurologic: CN 2-12 grossly intact. Sensation intact, DTR normal. Strength 5/5 in all 4.  Psychiatric: Normal judgment and insight. Alert and oriented x  3. Normal mood.   Data Reviewed:    Labs on Admission: I have personally reviewed following labs and imaging studies  CBC: Recent Labs  Lab 06/05/23 2018  WBC 18.6*  NEUTROABS 16.9*  HGB 15.2  HCT 46.8  MCV 95.7  PLT 171   Basic Metabolic Panel: Recent Labs  Lab 06/05/23 2018  NA 137  K 4.3  CL 102  CO2 23  GLUCOSE 167*  BUN 19  CREATININE 1.18  CALCIUM 9.7   GFR: Estimated Creatinine Clearance: 72.8 mL/min (by C-G formula based on SCr of  1.18 mg/dL). Liver Function Tests: Recent Labs  Lab 06/05/23 2018  AST 18  ALT 18  ALKPHOS 41  BILITOT 0.6  PROT 7.5  ALBUMIN 3.7   Recent Labs  Lab 06/05/23 2018  LIPASE 31   No results for input(s): "AMMONIA" in the last 168 hours. Coagulation Profile: Recent Labs  Lab 06/05/23 2032  INR 1.1   Cardiac Enzymes: No results for input(s): "CKTOTAL", "CKMB", "CKMBINDEX", "TROPONINI" in the last 168 hours. BNP (last 3 results) No results for input(s): "PROBNP" in the last 8760 hours. HbA1C: No results for input(s): "HGBA1C" in the last 72 hours. CBG: No results for input(s): "GLUCAP" in the last 168 hours. Lipid Profile: No results for input(s): "CHOL", "HDL", "LDLCALC", "TRIG", "CHOLHDL", "LDLDIRECT" in the last 72 hours. Thyroid Function Tests: No results for input(s): "TSH", "T4TOTAL", "FREET4", "T3FREE", "THYROIDAB" in the last 72 hours. Anemia Panel: No results for input(s): "VITAMINB12", "FOLATE", "FERRITIN", "TIBC", "IRON", "RETICCTPCT" in the last 72 hours. Urine analysis:    Component Value Date/Time   COLORURINE YELLOW 06/05/2023 0012   APPEARANCEUR HAZY (A) 06/05/2023 0012   LABSPEC 1.026 06/05/2023 0012   PHURINE 5.0 06/05/2023 0012   GLUCOSEU 150 (A) 06/05/2023 0012   HGBUR NEGATIVE 06/05/2023 0012   BILIRUBINUR NEGATIVE 06/05/2023 0012   KETONESUR NEGATIVE 06/05/2023 0012   PROTEINUR 30 (A) 06/05/2023 0012   NITRITE NEGATIVE 06/05/2023 0012   LEUKOCYTESUR NEGATIVE 06/05/2023 0012     Radiological Exams on Admission: CT ABDOMEN PELVIS WO CONTRAST  Result Date: 06/06/2023 CLINICAL DATA:  Right lower quadrant abdominal pain, chills, fever EXAM: CT ABDOMEN AND PELVIS WITHOUT CONTRAST TECHNIQUE: Multidetector CT imaging of the abdomen and pelvis was performed following the standard protocol without IV contrast. RADIATION DOSE REDUCTION: This exam was performed according to the departmental dose-optimization program which includes automated exposure control, adjustment of the mA and/or kV according to patient size and/or use of iterative reconstruction technique. COMPARISON:  04/23/2016 FINDINGS: Lower chest: No acute abnormality Hepatobiliary: Gallstones layering within the gallbladder measuring up to 1.5 cm. No biliary ductal dilatation. Mild low-density throughout the liver compatible with fatty infiltration. No focal abnormality. Pancreas: No focal abnormality or ductal dilatation. Spleen: No focal abnormality.  Normal size. Adrenals/Urinary Tract: No adrenal abnormality. No focal renal abnormality. No stones or hydronephrosis. Urinary bladder is unremarkable. Stomach/Bowel: Colonic diverticulosis. No active diverticulitis. Appendix is not visualized. No inflammatory process in the right lower quadrant. Stomach and small bowel decompressed, unremarkable. Vascular/Lymphatic: Aortic atherosclerosis. No evidence of aneurysm or adenopathy. Reproductive: No visible focal abnormality. Other: No free fluid or free air. Musculoskeletal: No acute bony abnormality. Diffuse degenerative disc and facet disease. IMPRESSION: No acute findings in the abdomen or pelvis. Cholelithiasis.  No CT evidence of acute cholecystitis. Hepatic steatosis. Aortic atherosclerosis. Electronically Signed   By: Charlett Nose M.D.   On: 06/06/2023 01:11   DG Chest Portable 1 View  Result Date: 06/05/2023 CLINICAL DATA:  Chest tightness EXAM: PORTABLE CHEST 1 VIEW COMPARISON:  12/24/2020 FINDINGS: Heart and mediastinal  contours are within normal limits. Peribronchial thickening and diffuse interstitial prominence may reflect bronchitis or chronic lung disease. No acute confluent opacities or effusions. No acute bony abnormality. IMPRESSION: Peribronchial thickening and interstitial prominence could reflect bronchitis or chronic lung disease. Electronically Signed   By: Charlett Nose M.D.   On: 06/05/2023 22:27    EKG: Independently reviewed.   Assessment and Plan: * Influenza-like illness Pt with ILI symptoms, COVID-19 neg, RVP pending to determine if influenza present (havent  seen much of it yet this year). COPD pathway for bronchitis Check RVP, illness sounds very much like Influenza. Start tamiflu if influenza confirmed. Scheduled LABA, LAMA, and Inhaled steroid PRN SABA Got dose of zosyn in ED for fever + WBC Will leave on rocephin for the moment Procalcitonin pending Cont pulse ox Not really wheezing, will hold off on systemic steroid for the moment, especially with question of influenza.  Acute respiratory failure with hypoxia (HCC) Patient has acute respiratory failure with hypoxia due to having a new oxygen requirement.  That is the patient has a PaO2 < 60 (pulse Ox < 90%) on room air. O2 via [place]  DM2 (diabetes mellitus, type 2) (HCC) Hold metformin Mod scale SSI AC.  History of DVT (deep vein thrombosis) Cont chronic home eliquis.  Hypertensive disorder Cont home BP meds      Advance Care Planning:   Code Status: Full Code  Consults: None  Family Communication: Family at bedside  Severity of Illness: The appropriate patient status for this patient is OBSERVATION. Observation status is judged to be reasonable and necessary in order to provide the required intensity of service to ensure the patient's safety. The patient's presenting symptoms, physical exam findings, and initial radiographic and laboratory data in the context of their medical condition is felt to place them at  decreased risk for further clinical deterioration. Furthermore, it is anticipated that the patient will be medically stable for discharge from the hospital within 2 midnights of admission.   Author: Hillary Bow., DO 06/06/2023 4:34 AM  For on call review [email or web address].

## 2023-06-06 NOTE — Evaluation (Signed)
Occupational Therapy Evaluation Patient Details Name: Donald Hester MRN: [id number] DOB: 1950-09-17 Today's Date: 06/06/2023   History of Present Illness Pt is a 72 y.o. male who presented to the [facility] ED on 9/28 with 1 day h/o sudden onset fever, chills, body aches, cough, mild SOB, nausea, diarrhea and was found to be influenza positive. PMH: DVT on chronic eliquis, HTN, COPD, asthma, Bell's palsy, HTN, hiatal hernia, degenerative disc disease (cervical), anxiety, depression   Clinical Impression   At baseline, pt is Independent to Mod I with ADLs, IADL, and functional mobility without an AD in the home and with a Rollator in the community. Pt now presents with decreased activity tolerance, fatigue, decreased balance during functional tasks, and decreased safety and independence with functional tasks. Pt currently demonstrates ability to complete ADLs Independent to Contact guard assist and functional transfers with use of RW with Contact guard to Min assist. Pt limited this session by dizziness in sitting worse in standing with noted episode of orthostatic hypotension (see additional details in General Comments below). O2 between 91-93% on RA throughout session and HR in mid-80s to upper-90s. Pt will benefit from acute skilled OT services to address deficits outlined below and to increase safety and independence with functional tasks. Pending pt progress, no need for post acute OT follow up is anticipated at this time.       If plan is discharge home, recommend the following: A little help with walking and/or transfers;A little help with bathing/dressing/bathroom;Assistance with cooking/housework;Assist for transportation;Help with stairs or ramp for entrance    Functional Status Assessment  Patient has had a recent decline in their functional status and demonstrates the ability to make significant improvements in function in a reasonable and predictable amount of time.  Equipment Recommendations   None recommended by OT (Pt already has needed equipment)    Recommendations for Other Services       Precautions / Restrictions Precautions Precautions: Fall Precaution Comments: watch BP Restrictions Weight Bearing Restrictions: No      Mobility Bed Mobility Overal bed mobility: Modified Independent                  Transfers Overall transfer level: Needs assistance Equipment used: Rolling walker (2 wheels) Transfers: Sit to/from Stand, Bed to chair/wheelchair/BSC Sit to Stand: Contact guard assist     Step pivot transfers: Contact guard assist, Min assist (with use of RW)     General transfer comment: Contact guard assist for safety in standing due to dizziness with episode of orthostatic hypotention and Contact guard to Min assist needed to steady in step-pivot transfer all with use of RW.      Balance Overall balance assessment: Needs assistance Sitting-balance support: No upper extremity supported, Feet supported Sitting balance-Leahy Scale: Good Sitting balance - Comments: Pt demonstrating Good sitting balance but with pt reporting dizziness in sitting and with episode of orthostatic hypotension   Standing balance support: Bilateral upper extremity supported, During functional activity, Reliant on assistive device for balance Standing balance-Leahy Scale: Poor Standing balance comment: Pt with increased dizziness and episode of orthostatic hypotension in standing this session.                           ADL either performed or assessed with clinical judgement   ADL Overall ADL's : Needs assistance/impaired Eating/Feeding: Independent;Sitting   Grooming: Set up;Sitting   Upper Body Bathing: Supervision/ safety;Sitting   Lower Body Bathing: Contact guard assist;Sit  to/from stand   Upper Body Dressing : Set up;Sitting   Lower Body Dressing: Contact guard assist;Sit to/from stand   Toilet Transfer: Contact guard assist;Minimal  assistance;BSC/3in1;Rolling walker (2 wheels) (step-pivot transfer)   Toileting- Clothing Manipulation and Hygiene: Contact guard assist;Sit to/from stand         General ADL Comments: Pt with decreased activity tolerance during functional tasks with pt fatiguing quickly. Functional mobility deferred this session for pt/therapist safety secondary to pt presenting with dizziness in sitting and worse in standing with episode of orthostatic hypotension noted and with noted LOB with pt able to self correct with decreased BP in standing.     Vision Baseline Vision/History: 1 Wears glasses Ability to See in Adequate Light: 0 Adequate Patient Visual Report: No change from baseline       Perception         Praxis         Pertinent Vitals/Pain Pain Assessment Pain Assessment: No/denies pain     Extremity/Trunk Assessment Upper Extremity Assessment Upper Extremity Assessment: Right hand dominant;Overall WFL for tasks assessed   Lower Extremity Assessment Lower Extremity Assessment: Defer to PT evaluation   Cervical / Trunk Assessment Cervical / Trunk Assessment: Other exceptions Cervical / Trunk Exceptions: Pt reports hx of back/neck surgery.   Communication Communication Communication: Hearing impairment (has hearing aides)   Cognition Arousal: Alert Behavior During Therapy: WFL for tasks assessed/performed Overall Cognitive Status: Within Functional Limits for tasks assessed                                 General Comments: AAOx4 and pleasant throughout session. Able to follow multi-step commands consistently and demonstrates good safety awareness and insight into deficits.     General Comments  Pt with BP 116/59 in sitting with pt reporting dizziness, BP 102/62 in 1 minute standing with pt reporting increased dizziness, BP 124/72 after returning to supine in the bed with pt reporting decreased dizziness. Pt O2 sat between 90-93% on RA throughout session. Pt's  HR from mid-80s to upper-90s throughout session.    Exercises     Shoulder Instructions      Home Living Family/patient expects to be discharged to:: Private residence Living Arrangements: Alone Available Help at Discharge: Family;Available PRN/intermittently Type of Home: Mobile home Home Access: Ramped entrance     Home Layout: One level     Bathroom Shower/Tub: Chief Strategy Officer: Standard Bathroom Accessibility: Yes How Accessible: Accessible via walker Home Equipment: Grab bars - tub/shower;Hand held shower head;Rollator (4 wheels);Cane - single point;Shower seat          Prior Functioning/Environment Prior Level of Function : Independent/Modified Independent;Driving             Mobility Comments: At baseline, pt is Independent to Mod I with no use of AD in the home and use of Rollator in the community for ambulating longer distances ADLs Comments: At baseline, pt is Independent to Mod I with ADLs and IADLs and drives.        OT Problem List: Decreased activity tolerance;Impaired balance (sitting and/or standing);Cardiopulmonary status limiting activity      OT Treatment/Interventions: Self-care/ADL training;Energy conservation;DME and/or AE instruction;Therapeutic activities;Patient/family education;Balance training    OT Goals(Current goals can be found in the care plan section) Acute Rehab OT Goals Patient Stated Goal: To feel better and return home OT Goal Formulation: With patient Time For Goal Achievement: 06/20/23  Potential to Achieve Goals: Good ADL Goals Pt Will Perform Grooming: with modified independence;standing Pt Will Perform Lower Body Dressing: with modified independence;sitting/lateral leans;sit to/from stand Pt Will Transfer to Toilet: with modified independence;ambulating;regular height toilet (with least restrictive AD) Pt Will Perform Toileting - Clothing Manipulation and hygiene: with modified independence;sit to/from  stand;sitting/lateral leans Additional ADL Goal #1: Patient will demonstrate ability to Independently state 4 energy conservation strategies to increase safety and independence with functional tasks in the home. Additional ADL Goal #2: Patient will demonstrate understanding of education regarding recognizing, helping to prevent, and appropriately responding to episodes of orthostatic hypotension through teach back with handout provided.  OT Frequency: Min 1X/week    Co-evaluation              AM-PAC OT "6 Clicks" Daily Activity     Outcome Measure Help from another person eating meals?: None Help from another person taking care of personal grooming?: A Little Help from another person toileting, which includes using toliet, bedpan, or urinal?: A Little Help from another person bathing (including washing, rinsing, drying)?: A Little Help from another person to put on and taking off regular upper body clothing?: A Little Help from another person to put on and taking off regular lower body clothing?: A Little 6 Click Score: 19   End of Session Equipment Utilized During Treatment: Gait belt;Rolling walker (2 wheels) Nurse Communication: Mobility status;Other (comment) (Vital signs/episode of orthostatic hypotension)  Activity Tolerance: Patient tolerated treatment well;Treatment limited secondary to medical complications (Comment);Patient limited by fatigue (Pt limited by dizziness and episode of orthostatic hypotension.) Patient left: in bed;with call bell/phone within reach;with bed alarm set  OT Visit Diagnosis: Other abnormalities of gait and mobility (R26.89);Other (comment) (Decreased activity tolerance)                Time: 4098-1191 OT Time Calculation (min): 23 min Charges:  OT General Charges $OT Visit: 1 Visit OT Evaluation $OT Eval Low Complexity: 1 Low OT Treatments $Self Care/Home Management : 8-22 mins  Desmin Daleo "Orson Eva., OTR/L, MA Acute Rehab [phone]   Lendon Colonel 06/06/2023, 5:05 PM

## 2023-06-06 NOTE — ED Notes (Signed)
Patient turned and cleaned.

## 2023-06-06 NOTE — Plan of Care (Signed)

## 2023-06-06 NOTE — ED Notes (Signed)
Patient transported to CT 

## 2023-06-06 NOTE — Assessment & Plan Note (Signed)
Hold metformin Mod scale SSI AC 

## 2023-06-06 NOTE — Evaluation (Signed)
Physical Therapy Evaluation Patient Details Name: Donald Hester MRN: [id number] DOB: June 21, 1951 Today's Date: 06/06/2023  History of Present Illness  Pt is a 72 y.o. male who presented to the [facility] ED on 9/28 with 1 day h/o sudden onset fever, chills, body aches, cough, mild SOB, nausea, diarrhea and was found to be influenza positive. 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  Pt presently is CGA to mod I in a small area.  Pt became SOB, but maintained Sats during gait in the hall.  I hope pt's family will ramp up checking in on him, but do not expect he needs follow up and can still manage living alone.  Pt currently limited functionally due to the problems listed below.  (see problems list.)  Pt will benefit from PT to maximize function and safety to be able to get home safely with available assist .         If plan is discharge home, recommend the following: Assistance with cooking/housework;Assist for transportation   Can travel by private vehicle        Equipment Recommendations None recommended by PT  Recommendations for Other Services       Functional Status Assessment Patient has had a recent decline in their functional status and demonstrates the ability to make significant improvements in function in a reasonable and predictable amount of time.     Precautions / Restrictions Precautions Precautions: Fall Precaution Comments: watch BP Restrictions Weight Bearing Restrictions: No      Mobility  Bed Mobility Overal bed mobility: Modified Independent                  Transfers Overall transfer level: Needs assistance Equipment used: Rolling walker (2 wheels) Transfers: Sit to/from Stand, Bed to chair/wheelchair/BSC Sit to Stand: Contact guard assist, Supervision   Step pivot transfers: Contact guard assist,  Supervision (with use of RW)       General transfer comment: Contact guard assist for safety in standing due to dizziness with episode of orthostatic hypotention and Contact guard to Min assist needed to steady in step-pivot transfer all with use of RW.    Ambulation/Gait Ambulation/Gait assistance: Contact guard assist Gait Distance (Feet): 60 Feet (x2 with standing rest propped on the wall.)   Gait Pattern/deviations: Step-through pattern       General Gait Details: stable wide BOS gait pattern, slower, pt getting SOB, but Sats maintained at 93%  HR 88 bpm.  Stairs            Wheelchair Mobility     Tilt Bed    Modified Rankin (Stroke Patients Only)       Balance Overall balance assessment: Needs assistance Sitting-balance support: No upper extremity supported, Feet supported Sitting balance-Leahy Scale: Good Sitting balance - Comments: Pt demonstrating Good sitting balance but with pt reporting dizziness in sitting and with episode of orthostatic hypotension   Standing balance support: During functional activity, Reliant on assistive device for balance, No upper extremity supported Standing balance-Leahy Scale: Fair Standing balance comment: No dizziness this pm,  BP's not rechecked as RN checked earlier                             Pertinent Vitals/Pain      Home Living Family/patient expects to be discharged to:: Private residence Living Arrangements:  Alone Available Help at Discharge: Family;Available PRN/intermittently Type of Home: Mobile home Home Access: Ramped entrance       Home Layout: One level Home Equipment: Grab bars - tub/shower;Hand held shower head;Rollator (4 wheels);Cane - single point;Shower seat      Prior Function Prior Level of Function : Independent/Modified Independent;Driving             Mobility Comments: At baseline, pt is Independent to Mod I with no use of AD in the home and use of Rollator in the  community for ambulating longer distances ADLs Comments: At baseline, pt is Independent to Mod I with ADLs and IADLs and drives.     Extremity/Trunk Assessment   Upper Extremity Assessment Upper Extremity Assessment: Overall WFL for tasks assessed    Lower Extremity Assessment Lower Extremity Assessment: Overall WFL for tasks assessed    Cervical / Trunk Assessment Cervical / Trunk Assessment: Other exceptions Cervical / Trunk Exceptions: Pt reports hx of back/neck surgery.  Communication   Communication Communication: Hearing impairment (has hearing aides)  Cognition Arousal: Alert Behavior During Therapy: WFL for tasks assessed/performed Overall Cognitive Status: Within Functional Limits for tasks assessed                                 General Comments: AAOx4 and pleasant throughout session. Able to follow multi-step commands consistently and demonstrates good safety awareness and insight into deficits.        General Comments General comments (skin integrity, edema, etc.): Pt with BP 116/59 in sitting with pt reproting dizziness, BP 102/62 in 1 minute standing with pt reporting increased dizziness, BP 124/72 after returning to supine in the bed with pt reporting decreased dizziness. Pt O2 sat between 90-93% on RA throughout session. Pt's HR from mid-80s to upper-90s throughout session.    Exercises     Assessment/Plan    PT Assessment Patient needs continued PT services  PT Problem List Decreased activity tolerance;Decreased balance;Decreased mobility;Decreased strength       PT Treatment Interventions Gait training;Stair training;Functional mobility training;Therapeutic activities;Balance training;Patient/family education    PT Goals (Current goals can be found in the Care Plan section)  Acute Rehab PT Goals Patient Stated Goal: home tomorrow hopefully PT Goal Formulation: With patient Time For Goal Achievement: 06/09/23 Potential to Achieve Goals:  Good    Frequency Min 1X/week     Co-evaluation               AM-PAC PT "6 Clicks" Mobility  Outcome Measure Help needed turning from your back to your side while in a flat bed without using bedrails?: None Help needed moving from lying on your back to sitting on the side of a flat bed without using bedrails?: None Help needed moving to and from a bed to a chair (including a wheelchair)?: A Little Help needed standing up from a chair using your arms (e.g., wheelchair or bedside chair)?: A Little Help needed to walk in hospital room?: A Little Help needed climbing 3-5 steps with a railing? : A Little 6 Click Score: 20    End of Session   Activity Tolerance: Patient tolerated treatment well Patient left: in bed;with call bell/phone within reach Nurse Communication: Mobility status PT Visit Diagnosis: Other abnormalities of gait and mobility (R26.89);Unsteadiness on feet (R26.81)    Time: 4098-1191 PT Time Calculation (min) (ACUTE ONLY): 16 min   Charges:   PT Evaluation $PT Eval Moderate  Complexity: 1 Mod   PT General Charges $$ ACUTE PT VISIT: 1 Visit         06/06/2023  Jacinto Halim., PT Acute Rehabilitation Services [phone]  (office)  Eliseo Gum Akram Kissick 06/06/2023, 6:47 PM

## 2023-06-06 NOTE — ED Notes (Signed)
ED TO INPATIENT HANDOFF REPORT  ED Nurse Name and Phone #: Raykwon Hobbs [phone]  S Name/Age/Gender Donald Hester 72 y.o. male Room/Bed: 023C/023C  Code Status   Code Status: Full Code  Home/SNF/Other Home Patient oriented to: self, place, time, and situation Is this baseline? Yes   Triage Complete: Triage complete  Chief Complaint Influenza-like illness [J11.1]  Triage Note Pt complaining of cough, chills, fever 103.1 abdominal pain, and some chest tightness after cough. All of this started earlier today around 2 pm. Ems gave 650 of tylenol.   Allergies Allergies  Allergen Reactions   Bee Venom Anaphylaxis   Dye Fdc Red [Red Dye #40 (Allura Red)] Anaphylaxis   Iodinated Contrast Media Swelling    Patient is unsure if he is allergic to Iodinated contrast or MRI contrast.   Flunisolide     Unknown reaction   Morphine And Codeine     Goes crazy per late wife   Oxycodone     Unknown reaction    Level of Care/Admitting Diagnosis ED Disposition     ED Disposition  Admit   Condition  --   Comment  Hospital Area: [facility] [[id number]]  Level of Care: Telemetry Medical [[id number]]  May place patient in observation at [facility] or [facility] if equivalent level of care is available:: No  Covid Evaluation: Asymptomatic - no recent exposure (last 10 days) testing not required  Diagnosis: Influenza-like illness [130865]  Admitting Physician: Hillary Bow [[id number]]  Attending Physician: Hillary Bow [[id number]]          B Medical/Surgery History Past Medical History:  Diagnosis Date   Anxiety    Asthma    Asthma    Bell's palsy    Chronic otitis externa    Degenerative disc disease, cervical    metal plates in neck   Depression    DVT (deep venous thrombosis) (HCC)    Fx ankle, right, closed, initial encounter    Hiatal hernia    High cholesterol    Hypertension    Insomnia    Unsp fracture of unsp forearm, init for opn fx type I/2    Unsp  fracture of unsp lower leg, init for opn fx type I/2    Past Surgical History:  Procedure Laterality Date   APPENDECTOMY     BACK SURGERY     COLONOSCOPY WITH PROPOFOL N/A 01/11/2018   Procedure: COLONOSCOPY WITH PROPOFOL;  Surgeon: Wyline Mood, MD;  Location: [facility] ENDOSCOPY;  Service: Gastroenterology;  Laterality: N/A;     A IV Location/Drains/Wounds Patient Lines/Drains/Airways Status     Active Line/Drains/Airways     Name Placement date Placement time Site Days   Peripheral IV 06/05/23 20 G 1" Anterior;Left;Proximal Forearm 06/05/23  1902  Forearm  1   Peripheral IV 06/05/23 20 G Right Antecubital 06/05/23  2027  Antecubital  1            Intake/Output Last 24 hours No intake or output data in the 24 hours ending 06/06/23 9629  Labs/Imaging Results for orders placed or performed during the hospital encounter of 06/05/23 (from the past 48 hour(s))  Urinalysis, w/ Reflex to Culture (Infection Suspected) -Urine, Clean Catch     Status: Abnormal   Collection Time: 06/05/23 12:12 AM  Result Value Ref Range   Specimen Source URINE, CLEAN CATCH    Color, Urine YELLOW YELLOW   APPearance HAZY (A) CLEAR   Specific Gravity, Urine 1.026 1.005 - 1.030  pH 5.0 5.0 - 8.0   Glucose, UA 150 (A) NEGATIVE mg/dL   Hgb urine dipstick NEGATIVE NEGATIVE   Bilirubin Urine NEGATIVE NEGATIVE   Ketones, ur NEGATIVE NEGATIVE mg/dL   Protein, ur 30 (A) NEGATIVE mg/dL   Nitrite NEGATIVE NEGATIVE   Leukocytes,Ua NEGATIVE NEGATIVE   RBC / HPF 0-5 0 - 5 RBC/hpf   WBC, UA 0-5 0 - 5 WBC/hpf    Comment:        Reflex urine culture not performed if WBC <=10, OR if Squamous epithelial cells >5. If Squamous epithelial cells >5 suggest recollection.    Bacteria, UA RARE (A) NONE SEEN   Squamous Epithelial / HPF 0-5 0 - 5 /HPF   Mucus PRESENT    Hyaline Casts, UA PRESENT     Comment: Performed at [facility], [address] [place], [place] [address]  SARS Coronavirus 2 by RT PCR  (hospital order, performed in [facility] hospital lab) *cepheid single result test* Anterior Nasal Swab     Status: None   Collection Time: 06/05/23  7:07 PM   Specimen: Anterior Nasal Swab  Result Value Ref Range   SARS Coronavirus 2 by RT PCR NEGATIVE NEGATIVE    Comment: Performed at [facility], [address] [place], [place] [address]  Lipase, blood     Status: None   Collection Time: 06/05/23  8:18 PM  Result Value Ref Range   Lipase 31 11 - 51 U/L    Comment: Performed at [facility], [address], [place], [place] [address]  Comprehensive metabolic panel     Status: Abnormal   Collection Time: 06/05/23  8:18 PM  Result Value Ref Range   Sodium 137 135 - 145 mmol/L   Potassium 4.3 3.5 - 5.1 mmol/L   Chloride 102 98 - 111 mmol/L   CO2 23 22 - 32 mmol/L   Glucose, Bld 167 (H) 70 - 99 mg/dL    Comment: Glucose reference range applies only to samples taken after fasting for at least 8 hours.   BUN 19 8 - 23 mg/dL   Creatinine, Ser 9.14 0.61 - 1.24 mg/dL   Calcium 9.7 8.9 - 78.2 mg/dL   Total Protein 7.5 6.5 - 8.1 g/dL   Albumin 3.7 3.5 - 5.0 g/dL   AST 18 15 - 41 U/L   ALT 18 0 - 44 U/L   Alkaline Phosphatase 41 38 - 126 U/L   Total Bilirubin 0.6 0.3 - 1.2 mg/dL   GFR, Estimated >95 >62 mL/min    Comment: (NOTE) Calculated using the CKD-EPI Creatinine Equation (2021)    Anion gap 12 5 - 15    Comment: Performed at [facility], [address] [place], [place] [address]  CBC with Differential     Status: Abnormal   Collection Time: 06/05/23  8:18 PM  Result Value Ref Range   WBC 18.6 (H) 4.0 - 10.5 K/uL   RBC 4.89 4.22 - 5.81 MIL/uL   Hemoglobin 15.2 13.0 - 17.0 g/dL   HCT 57.8 46.9 - 62.9 %   MCV 95.7 80.0 - 100.0 fL   MCH 31.1 26.0 - 34.0 pg   MCHC 32.5 30.0 - 36.0 g/dL   RDW 52.8 41.3 - 24.4 %   Platelets 171 150 - 400 K/uL   nRBC 0.0 0.0 - 0.2 %   Neutrophils Relative % 91 %   Neutro Abs 16.9 (H) 1.7 - 7.7 K/uL  Lymphocytes Relative 2  %   Lymphs Abs 0.4 (L) 0.7 - 4.0 K/uL   Monocytes Relative 6 %   Monocytes Absolute 1.1 (H) 0.1 - 1.0 K/uL   Eosinophils Relative 0 %   Eosinophils Absolute 0.0 0.0 - 0.5 K/uL   Basophils Relative 0 %   Basophils Absolute 0.1 0.0 - 0.1 K/uL   Immature Granulocytes 1 %   Abs Immature Granulocytes 0.13 (H) 0.00 - 0.07 K/uL    Comment: Performed at [facility], [address], [place], [place] [address]  Protime-INR     Status: None   Collection Time: 06/05/23  8:32 PM  Result Value Ref Range   Prothrombin Time 14.3 11.4 - 15.2 seconds   INR 1.1 0.8 - 1.2    Comment: (NOTE) INR goal varies based on device and disease states. Performed at [facility], [address] [place], [place] [address]   APTT     Status: None   Collection Time: 06/05/23  8:32 PM  Result Value Ref Range   aPTT 31 24 - 36 seconds    Comment: Performed at [facility], [address], [place], [place] [address]  I-Stat Lactic Acid, ED     Status: None   Collection Time: 06/05/23  8:39 PM  Result Value Ref Range   Lactic Acid, Venous 1.9 0.5 - 1.9 mmol/L   CT ABDOMEN PELVIS WO CONTRAST  Result Date: 06/06/2023 CLINICAL DATA:  Right lower quadrant abdominal pain, chills, fever EXAM: CT ABDOMEN AND PELVIS WITHOUT CONTRAST TECHNIQUE: Multidetector CT imaging of the abdomen and pelvis was performed following the standard protocol without IV contrast. RADIATION DOSE REDUCTION: This exam was performed according to the departmental dose-optimization program which includes automated exposure control, adjustment of the mA and/or kV according to patient size and/or use of iterative reconstruction technique. COMPARISON:  04/23/2016 FINDINGS: Lower chest: No acute abnormality Hepatobiliary: Gallstones layering within the gallbladder measuring up to 1.5 cm. No biliary ductal dilatation. Mild low-density throughout the liver compatible with fatty infiltration. No focal abnormality. Pancreas: No focal  abnormality or ductal dilatation. Spleen: No focal abnormality.  Normal size. Adrenals/Urinary Tract: No adrenal abnormality. No focal renal abnormality. No stones or hydronephrosis. Urinary bladder is unremarkable. Stomach/Bowel: Colonic diverticulosis. No active diverticulitis. Appendix is not visualized. No inflammatory process in the right lower quadrant. Stomach and small bowel decompressed, unremarkable. Vascular/Lymphatic: Aortic atherosclerosis. No evidence of aneurysm or adenopathy. Reproductive: No visible focal abnormality. Other: No free fluid or free air. Musculoskeletal: No acute bony abnormality. Diffuse degenerative disc and facet disease. IMPRESSION: No acute findings in the abdomen or pelvis. Cholelithiasis.  No CT evidence of acute cholecystitis. Hepatic steatosis. Aortic atherosclerosis. Electronically Signed   By: Charlett Nose M.D.   On: 06/06/2023 01:11   DG Chest Portable 1 View  Result Date: 06/05/2023 CLINICAL DATA:  Chest tightness EXAM: PORTABLE CHEST 1 VIEW COMPARISON:  12/24/2020 FINDINGS: Heart and mediastinal contours are within normal limits. Peribronchial thickening and diffuse interstitial prominence may reflect bronchitis or chronic lung disease. No acute confluent opacities or effusions. No acute bony abnormality. IMPRESSION: Peribronchial thickening and interstitial prominence could reflect bronchitis or chronic lung disease. Electronically Signed   By: Charlett Nose M.D.   On: 06/05/2023 22:27    Pending Labs Unresulted Labs (From admission, onward)     Start     Ordered   06/06/23 0500  CBC  Tomorrow morning,   R  06/06/23 0310   06/06/23 0500  Comprehensive metabolic panel  Tomorrow morning,   R        06/06/23 0310   06/06/23 0258  HIV Antibody (routine testing w rflx)  (HIV Antibody (Routine testing w reflex) panel)  Once,   R        06/06/23 0310   06/06/23 0257  Procalcitonin  ONCE - URGENT,   URGENT       References:    Procalcitonin Lower  Respiratory Tract Infection AND Sepsis Procalcitonin Algorithm   06/06/23 0256   06/06/23 0210  Respiratory (~20 pathogens) panel by PCR  (Respiratory panel by PCR (~20 pathogens, ~24 hr TAT)  w precautions)  Once,   URGENT        06/06/23 0209   06/05/23 2021  Blood Culture (routine x 2)  (Undifferentiated presentation (screening labs and basic nursing orders))  BLOOD CULTURE X 2,   STAT      06/05/23 2020            Vitals/Pain Today's Vitals   06/05/23 2344 06/06/23 0106 06/06/23 0130 06/06/23 0145  BP:   132/78 133/82  Pulse:   97 99  Resp:   (!) 25 (!) 23  Temp:  99.8 F (37.7 C)    TempSrc:  Oral    SpO2:   93% 92%  Weight:      Height:      PainSc: 0-No pain       Isolation Precautions Droplet precaution  Medications Medications  mometasone-formoterol (DULERA) 200-5 MCG/ACT inhaler 2 puff (has no administration in time range)  albuterol (PROVENTIL) (2.5 MG/3ML) 0.083% nebulizer solution 2.5 mg (has no administration in time range)  umeclidinium bromide (INCRUSE ELLIPTA) 62.5 MCG/ACT 1 puff (has no administration in time range)  apixaban (ELIQUIS) tablet 5 mg (has no administration in time range)  cefTRIAXone (ROCEPHIN) 1 g in sodium chloride 0.9 % 100 mL IVPB (has no administration in time range)  acetaminophen (TYLENOL) tablet 325 mg (325 mg Oral Given 06/05/23 2040)  ipratropium-albuterol (DUONEB) 0.5-2.5 (3) MG/3ML nebulizer solution 3 mL (3 mLs Nebulization Given 06/05/23 2322)  ipratropium-albuterol (DUONEB) 0.5-2.5 (3) MG/3ML nebulizer solution 3 mL (3 mLs Nebulization Given 06/05/23 2332)  sodium chloride 0.9 % bolus 500 mL (0 mLs Intravenous Stopped 06/06/23 0141)  piperacillin-tazobactam (ZOSYN) IVPB 3.375 g (0 g Intravenous Stopped 06/06/23 0052)    Mobility walks with device     Focused Assessments     R Recommendations: See Admitting Provider Note  Report given to:   Additional Notes:

## 2023-06-06 NOTE — ED Notes (Signed)
PT is ambulating with assistance to the bathroom, with the help from family.

## 2023-06-07 DIAGNOSIS — J111 Influenza due to unidentified influenza virus with other respiratory manifestations: Secondary | ICD-10-CM | POA: Diagnosis not present

## 2023-06-07 LAB — GLUCOSE, CAPILLARY: Glucose-Capillary: 153 mg/dL — ABNORMAL HIGH (ref 70–99)

## 2023-06-07 MED ORDER — LOPERAMIDE HCL 2 MG PO CAPS
2.0000 mg | ORAL_CAPSULE | Freq: Once | ORAL | Status: AC | PRN
  Administered 2023-06-07: 2 mg via ORAL
  Filled 2023-06-07: qty 1

## 2023-06-07 MED ORDER — APIXABAN 5 MG PO TABS: 5.0000 mg | ORAL_TABLET | Freq: Two times a day (BID) | ORAL | 2 refills | Status: AC

## 2023-06-07 MED ORDER — LEVOFLOXACIN 500 MG PO TABS: 500.0000 mg | ORAL_TABLET | Freq: Every day | ORAL | 0 refills | Status: AC

## 2023-06-07 NOTE — Discharge Instructions (Signed)
Advised to take levofloxacin 500 mg daily for 3 days for bronchitis.

## 2023-06-07 NOTE — Discharge Summary (Signed)
Physician Discharge Summary  Donald Hester UJW:[id number] DOB: Feb 14, 1951 DOA: 06/05/2023  PCP: [facility]  Admit date: 06/05/2023  Discharge date: 06/07/2023  Admitted From: Home  Disposition:  Home  Recommendations for Outpatient Follow-up:  Follow up with PCP in 1-2 weeks Please obtain BMP/CBC in one week Advised to take levofloxacin 500 mg daily for 3 days for bronchitis.  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  Patient was febrile on arrival in the ED.  Patient was given Tylenol.  Patient was admitted for viral illness. COVID negative, respiratory viral panel negative, influenza negative.  Patient was started on Tamiflu for suspected Influenza.  Patient was also started on empiric antibiotics for presumed bronchitis.  Continued on home inhalers for COPD.  Patient is on room air, did not require oxygenation.  Patient felt much improved following day, He has participated with physical therapy, No PT needs recommended.  Patient feels better and wants to be discharged.  Procalcitonin 5.52.  Patient is being discharged on levofloxacin 500 mg daily for 3 more days.  Advised to continue home inhalers and follow-up with primary care physician.  Discharge Diagnoses:  Principal Problem:   Influenza-like illness Active Problems:   Acute respiratory failure with hypoxia (HCC)   Hypertensive disorder   Moderate COPD (chronic obstructive pulmonary disease) (HCC)   History of DVT (deep vein thrombosis)   DM2 (diabetes mellitus, type 2) (HCC)    Discharge Instructions:  Discharge Instructions     Call MD for:  difficulty  breathing, headache or visual disturbances   Complete by: As directed    Call MD for:  persistant dizziness or light-headedness   Complete by: As directed    Call MD for:  persistant nausea and vomiting   Complete by: As directed    Diet - low sodium heart healthy   Complete by: As directed    Diet Carb Modified   Complete by: As directed    Discharge instructions   Complete by: As directed    Advised to follow-up with primary care physician in 1 week. Advised to take levofloxacin 500 mg daily for 3 days for bronchitis.   Discharge wound care:   Complete by: As directed    Follow up PCP in one week.   Increase activity slowly   Complete by: As directed       Allergies as of 06/07/2023       Reactions   Bee Venom Anaphylaxis   Dye Fdc Red [red Dye #40 (allura Red)] Anaphylaxis   Iodinated Contrast Media Swelling   Patient is unsure if he is allergic to Iodinated contrast or MRI contrast.   Flunisolide    Unknown reaction   Morphine And Codeine    Goes crazy per late wife   Oxycodone    Anxiety per [facility] record        Medication List     TAKE these medications    albuterol 108 (90 Base) MCG/ACT inhaler Commonly known as: VENTOLIN HFA Inhale 1 puff into the lungs every 6 (six) hours as needed for wheezing or shortness of breath.   amLODipine 10 MG tablet Commonly known as: NORVASC Take 10 mg by mouth daily.  apixaban 5 MG Tabs tablet Commonly known as: ELIQUIS Take 1 tablet (5 mg total) by mouth 2 (two) times daily.   atorvastatin 20 MG tablet Commonly known as: LIPITOR Take 20 mg by mouth daily.   cetirizine 10 MG tablet Commonly known as: ZYRTEC Take 10 mg by mouth daily.   cholecalciferol 25 MCG (1000 UNIT) tablet Commonly known as: VITAMIN D3 Take 1,000 Units by mouth daily.   citalopram 40 MG tablet Commonly known as: CELEXA Take 40 mg by mouth daily.   cyanocobalamin 1000 MCG tablet Commonly known as: VITAMIN B12 Take 1,000 mcg by mouth  daily.   docusate sodium 100 MG capsule Commonly known as: COLACE Take 100 mg by mouth daily as needed for mild constipation.   fenofibrate 145 MG tablet Commonly known as: TRICOR Take 145 mg by mouth daily.   fluticasone 50 MCG/ACT nasal spray Commonly known as: FLONASE Place 2 sprays into both nostrils 2 (two) times daily as needed for allergies.   fluticasone-salmeterol 250-50 MCG/ACT Aepb Commonly known as: ADVAIR Inhale 1 puff into the lungs in the morning and at bedtime.   gabapentin 300 MG capsule Commonly known as: NEURONTIN Take 900 mg by mouth 3 (three) times daily.   levofloxacin 500 MG tablet Commonly known as: Levaquin Take 1 tablet (500 mg total) by mouth daily for 3 days.   lisinopril 40 MG tablet Commonly known as: ZESTRIL Take 1 tablet (40 mg total) by mouth daily. What changed: how much to take   metFORMIN 500 MG 24 hr tablet Commonly known as: GLUCOPHAGE-XR Take 500-1,000 mg by mouth See admin instructions. Take 1 tablet by mouth every morning and 2 tablets in the evening   methocarbamol 500 MG tablet Commonly known as: ROBAXIN Take 500 mg by mouth 4 (four) times daily.   multivitamin capsule Take 1 capsule by mouth daily.   sodium chloride 0.65 % nasal spray Commonly known as: OCEAN Place 2 sprays into the nose 2 (two) times daily as needed for congestion.               Discharge Care Instructions  (From admission, onward)           Start     Ordered   06/07/23 0000  Discharge wound care:       Comments: Follow up PCP in one week.   06/07/23 1032            Follow-up Information     [facility] Follow up in 1 week(s).   Specialty: General Practice Contact information: [address] [place] [address] [phone]                Allergies  Allergen Reactions   Bee Venom Anaphylaxis   Dye Fdc Red [Red Dye #40 (Allura Red)] Anaphylaxis   Iodinated Contrast Media Swelling    Patient is unsure if  he is allergic to Iodinated contrast or MRI contrast.   Flunisolide     Unknown reaction   Morphine And Codeine     Goes crazy per late wife   Oxycodone     Anxiety per [facility] record    Consultations: None   Procedures/Studies: CT ABDOMEN PELVIS WO CONTRAST  Result Date: 06/06/2023 CLINICAL DATA:  Right lower quadrant abdominal pain, chills, fever EXAM: CT ABDOMEN AND PELVIS WITHOUT CONTRAST TECHNIQUE: Multidetector CT imaging of the abdomen and pelvis was performed following the standard protocol without IV contrast. RADIATION DOSE REDUCTION: This exam was performed according to  the departmental dose-optimization program which includes automated exposure control, adjustment of the mA and/or kV according to patient size and/or use of iterative reconstruction technique. COMPARISON:  04/23/2016 FINDINGS: Lower chest: No acute abnormality Hepatobiliary: Gallstones layering within the gallbladder measuring up to 1.5 cm. No biliary ductal dilatation. Mild low-density throughout the liver compatible with fatty infiltration. No focal abnormality. Pancreas: No focal abnormality or ductal dilatation. Spleen: No focal abnormality.  Normal size. Adrenals/Urinary Tract: No adrenal abnormality. No focal renal abnormality. No stones or hydronephrosis. Urinary bladder is unremarkable. Stomach/Bowel: Colonic diverticulosis. No active diverticulitis. Appendix is not visualized. No inflammatory process in the right lower quadrant. Stomach and small bowel decompressed, unremarkable. Vascular/Lymphatic: Aortic atherosclerosis. No evidence of aneurysm or adenopathy. Reproductive: No visible focal abnormality. Other: No free fluid or free air. Musculoskeletal: No acute bony abnormality. Diffuse degenerative disc and facet disease. IMPRESSION: No acute findings in the abdomen or pelvis. Cholelithiasis.  No CT evidence of acute cholecystitis. Hepatic steatosis. Aortic atherosclerosis. Electronically Signed   By: Charlett Nose  M.D.   On: 06/06/2023 01:11   DG Chest Portable 1 View  Result Date: 06/05/2023 CLINICAL DATA:  Chest tightness EXAM: PORTABLE CHEST 1 VIEW COMPARISON:  12/24/2020 FINDINGS: Heart and mediastinal contours are within normal limits. Peribronchial thickening and diffuse interstitial prominence may reflect bronchitis or chronic lung disease. No acute confluent opacities or effusions. No acute bony abnormality. IMPRESSION: Peribronchial thickening and interstitial prominence could reflect bronchitis or chronic lung disease. Electronically Signed   By: Charlett Nose M.D.   On: 06/05/2023 22:27    Subjective: Patient was seen and examined at bedside.  Overnight events noted.   Patient reports feeling very much improved.  He wants to be discharged home.  Discharge Exam: Vitals:   06/07/23 0736 06/07/23 0859  BP: 128/66   Pulse: 93   Resp: 18   Temp: 98 F (36.7 C)   SpO2: 95% 96%   Vitals:   06/07/23 0039 06/07/23 0434 06/07/23 0736 06/07/23 0859  BP: (!) 146/128 (!) 150/72 128/66   Pulse: 96 90 93   Resp: 18 18 18    Temp: 98.8 F (37.1 C) 97.8 F (36.6 C) 98 F (36.7 C)   TempSrc:   Oral   SpO2: (!) 86% (!) 88% 95% 96%  Weight:      Height:        General: Pt is alert, awake, not in acute distress Cardiovascular: RRR, S1/S2 +, no rubs, no gallops Respiratory: CTA bilaterally, no wheezing, no rhonchi Abdominal: Soft, NT, ND, bowel sounds + Extremities: no edema, no cyanosis    The results of significant diagnostics from this hospitalization (including imaging, microbiology, ancillary and laboratory) are listed below for reference.     Microbiology: Recent Results (from the past 240 hour(s))  SARS Coronavirus 2 by RT PCR (hospital order, performed in [facility] hospital lab) *cepheid single result test* Anterior Nasal Swab     Status: None   Collection Time: 06/05/23  7:07 PM   Specimen: Anterior Nasal Swab  Result Value Ref Range Status   SARS Coronavirus 2 by RT PCR  NEGATIVE NEGATIVE Final    Comment: Performed at [facility], [address], [place], [place] [address]  Blood Culture (routine x 2)     Status: None (Preliminary result)   Collection Time: 06/05/23  8:27 PM   Specimen: BLOOD  Result Value Ref Range Status   Specimen Description BLOOD RIGHT ANTECUBITAL  Final   Special Requests  Final    BOTTLES DRAWN AEROBIC AND ANAEROBIC Blood Culture adequate volume   Culture   Final    NO GROWTH 2 DAYS Performed at [facility], [address] [place], [place] [address]    Report Status PENDING  Incomplete  Blood Culture (routine x 2)     Status: None (Preliminary result)   Collection Time: 06/05/23  8:32 PM   Specimen: BLOOD  Result Value Ref Range Status   Specimen Description BLOOD LEFT ANTECUBITAL  Final   Special Requests   Final    BOTTLES DRAWN AEROBIC AND ANAEROBIC Blood Culture adequate volume   Culture   Final    NO GROWTH 2 DAYS Performed at [facility], [address], [place], [place] [address]    Report Status PENDING  Incomplete  Respiratory (~20 pathogens) panel by PCR     Status: None   Collection Time: 06/06/23  2:10 AM   Specimen: Nasopharyngeal Swab; Respiratory  Result Value Ref Range Status   Adenovirus NOT DETECTED NOT DETECTED Final   Coronavirus 229E NOT DETECTED NOT DETECTED Final    Comment: (NOTE) The Coronavirus on the Respiratory Panel, DOES NOT test for the novel  Coronavirus (2019 nCoV)    Coronavirus HKU1 NOT DETECTED NOT DETECTED Final   Coronavirus NL63 NOT DETECTED NOT DETECTED Final   Coronavirus OC43 NOT DETECTED NOT DETECTED Final   Metapneumovirus NOT DETECTED NOT DETECTED Final   Rhinovirus / Enterovirus NOT DETECTED NOT DETECTED Final   Influenza A NOT DETECTED NOT DETECTED Final   Influenza B NOT DETECTED NOT DETECTED Final   Parainfluenza Virus 1 NOT DETECTED NOT DETECTED Final   Parainfluenza Virus 2 NOT DETECTED NOT DETECTED Final   Parainfluenza Virus 3 NOT DETECTED  NOT DETECTED Final   Parainfluenza Virus 4 NOT DETECTED NOT DETECTED Final   Respiratory Syncytial Virus NOT DETECTED NOT DETECTED Final   Bordetella pertussis NOT DETECTED NOT DETECTED Final   Bordetella Parapertussis NOT DETECTED NOT DETECTED Final   Chlamydophila pneumoniae NOT DETECTED NOT DETECTED Final   Mycoplasma pneumoniae NOT DETECTED NOT DETECTED Final    Comment: Performed at [facility], [address], [place], [place] [address]     Labs: BNP (last 3 results) No results for input(s): "BNP" in the last 8760 hours. Basic Metabolic Panel: Recent Labs  Lab 06/05/23 2018 06/06/23 0931  NA 137 133*  K 4.3 4.3  CL 102 103  CO2 23 23  GLUCOSE 167* 136*  BUN 19 17  CREATININE 1.18 1.21  CALCIUM 9.7 8.9   Liver Function Tests: Recent Labs  Lab 06/05/23 2018 06/06/23 0931  AST 18 17  ALT 18 14  ALKPHOS 41 33*  BILITOT 0.6 0.6  PROT 7.5 6.8  ALBUMIN 3.7 3.2*   Recent Labs  Lab 06/05/23 2018  LIPASE 31   No results for input(s): "AMMONIA" in the last 168 hours. CBC: Recent Labs  Lab 06/05/23 2018 06/06/23 0931  WBC 18.6* 18.7*  NEUTROABS 16.9*  --   HGB 15.2 14.2  HCT 46.8 44.0  MCV 95.7 95.9  PLT 171 142*   Cardiac Enzymes: No results for input(s): "CKTOTAL", "CKMB", "CKMBINDEX", "TROPONINI" in the last 168 hours. BNP: Invalid input(s): "POCBNP" CBG: Recent Labs  Lab 06/06/23 0745 06/06/23 1216 06/06/23 1542 06/06/23 2128 06/07/23 0627  GLUCAP 153* 209* 129* 122* 153*   D-Dimer No results for input(s): "DDIMER" in the last 72 hours. Hgb A1c Recent Labs  06/06/23 0931  HGBA1C 6.6*   Lipid Profile No results for input(s): "CHOL", "HDL", "LDLCALC", "TRIG", "CHOLHDL", "LDLDIRECT" in the last 72 hours. Thyroid function studies No results for input(s): "TSH", "T4TOTAL", "T3FREE", "THYROIDAB" in the last 72 hours.  Invalid input(s): "FREET3" Anemia work up No results for input(s): "VITAMINB12", "FOLATE", "FERRITIN", "TIBC",  "IRON", "RETICCTPCT" in the last 72 hours. Urinalysis    Component Value Date/Time   COLORURINE YELLOW 06/05/2023 0012   APPEARANCEUR HAZY (A) 06/05/2023 0012   LABSPEC 1.026 06/05/2023 0012   PHURINE 5.0 06/05/2023 0012   GLUCOSEU 150 (A) 06/05/2023 0012   HGBUR NEGATIVE 06/05/2023 0012   BILIRUBINUR NEGATIVE 06/05/2023 0012   KETONESUR NEGATIVE 06/05/2023 0012   PROTEINUR 30 (A) 06/05/2023 0012   NITRITE NEGATIVE 06/05/2023 0012   LEUKOCYTESUR NEGATIVE 06/05/2023 0012   Sepsis Labs Recent Labs  Lab 06/05/23 2018 06/06/23 0931  WBC 18.6* 18.7*   Microbiology Recent Results (from the past 240 hour(s))  SARS Coronavirus 2 by RT PCR (hospital order, performed in [facility] hospital lab) *cepheid single result test* Anterior Nasal Swab     Status: None   Collection Time: 06/05/23  7:07 PM   Specimen: Anterior Nasal Swab  Result Value Ref Range Status   SARS Coronavirus 2 by RT PCR NEGATIVE NEGATIVE Final    Comment: Performed at [facility], [address] [place], [place] [address]  Blood Culture (routine x 2)     Status: None (Preliminary result)   Collection Time: 06/05/23  8:27 PM   Specimen: BLOOD  Result Value Ref Range Status   Specimen Description BLOOD RIGHT ANTECUBITAL  Final   Special Requests   Final    BOTTLES DRAWN AEROBIC AND ANAEROBIC Blood Culture adequate volume   Culture   Final    NO GROWTH 2 DAYS Performed at [facility], [address], [place], [place] [address]    Report Status PENDING  Incomplete  Blood Culture (routine x 2)     Status: None (Preliminary result)   Collection Time: 06/05/23  8:32 PM   Specimen: BLOOD  Result Value Ref Range Status   Specimen Description BLOOD LEFT ANTECUBITAL  Final   Special Requests   Final    BOTTLES DRAWN AEROBIC AND ANAEROBIC Blood Culture adequate volume   Culture   Final    NO GROWTH 2 DAYS Performed at [facility], [address] [place], [place] [address]    Report Status  PENDING  Incomplete  Respiratory (~20 pathogens) panel by PCR     Status: None   Collection Time: 06/06/23  2:10 AM   Specimen: Nasopharyngeal Swab; Respiratory  Result Value Ref Range Status   Adenovirus NOT DETECTED NOT DETECTED Final   Coronavirus 229E NOT DETECTED NOT DETECTED Final    Comment: (NOTE) The Coronavirus on the Respiratory Panel, DOES NOT test for the novel  Coronavirus (2019 nCoV)    Coronavirus HKU1 NOT DETECTED NOT DETECTED Final   Coronavirus NL63 NOT DETECTED NOT DETECTED Final   Coronavirus OC43 NOT DETECTED NOT DETECTED Final   Metapneumovirus NOT DETECTED NOT DETECTED Final   Rhinovirus / Enterovirus NOT DETECTED NOT DETECTED Final   Influenza A NOT DETECTED NOT DETECTED Final   Influenza B NOT DETECTED NOT DETECTED Final   Parainfluenza Virus 1 NOT DETECTED NOT DETECTED Final   Parainfluenza Virus 2 NOT DETECTED NOT DETECTED Final   Parainfluenza Virus 3 NOT DETECTED NOT DETECTED Final   Parainfluenza Virus 4  NOT DETECTED NOT DETECTED Final   Respiratory Syncytial Virus NOT DETECTED NOT DETECTED Final   Bordetella pertussis NOT DETECTED NOT DETECTED Final   Bordetella Parapertussis NOT DETECTED NOT DETECTED Final   Chlamydophila pneumoniae NOT DETECTED NOT DETECTED Final   Mycoplasma pneumoniae NOT DETECTED NOT DETECTED Final    Comment: Performed at [facility], [address], [place], [place] [address]     Time coordinating discharge: Over 30 minutes  SIGNED:   Willeen Niece, MD  [facility] 06/07/2023, 11:26 AM Pager   If 7PM-7AM, please contact night-coverage

## 2023-06-10 LAB — CULTURE, BLOOD (ROUTINE X 2)
Culture: NO GROWTH
Culture: NO GROWTH
Special Requests: ADEQUATE
Special Requests: ADEQUATE
